# Patient Record
Sex: Female | Born: 1990 | Race: White | Hispanic: No | Marital: Single | Smoking: Never smoker
Health system: Southern US, Community
[De-identification: ages and names within clinical notes are randomized; demographics above are authoritative.]

## PROBLEM LIST (undated history)

## (undated) DIAGNOSIS — F419 Anxiety disorder, unspecified: Secondary | ICD-10-CM

## (undated) DIAGNOSIS — Z9889 Other specified postprocedural states: Secondary | ICD-10-CM

## (undated) DIAGNOSIS — R112 Nausea with vomiting, unspecified: Secondary | ICD-10-CM

## (undated) DIAGNOSIS — T8859XA Other complications of anesthesia, initial encounter: Secondary | ICD-10-CM

## (undated) DIAGNOSIS — T4145XA Adverse effect of unspecified anesthetic, initial encounter: Secondary | ICD-10-CM

## (undated) HISTORY — PX: TONSILLECTOMY: SUR1361

## (undated) HISTORY — PX: WISDOM TOOTH EXTRACTION: SHX21

---
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
## 2015-09-25 ENCOUNTER — Emergency Department
Admission: EM | Admit: 2015-09-25 | Discharge: 2015-09-25 | Disposition: A | Discharge status: Home / Self Care | Payer: BLUE CROSS/BLUE SHIELD | Attending: Emergency Medicine | Admitting: Emergency Medicine

## 2015-09-25 DIAGNOSIS — S51852A Open bite of left forearm, initial encounter: Secondary | ICD-10-CM | POA: Insufficient documentation

## 2015-09-25 DIAGNOSIS — W540XXA Bitten by dog, initial encounter: Secondary | ICD-10-CM | POA: Diagnosis not present

## 2015-09-25 DIAGNOSIS — Y9389 Activity, other specified: Secondary | ICD-10-CM | POA: Diagnosis not present

## 2015-09-25 DIAGNOSIS — Y929 Unspecified place or not applicable: Secondary | ICD-10-CM | POA: Insufficient documentation

## 2015-09-25 DIAGNOSIS — Z91040 Latex allergy status: Secondary | ICD-10-CM | POA: Insufficient documentation

## 2015-09-25 DIAGNOSIS — Y998 Other external cause status: Secondary | ICD-10-CM | POA: Insufficient documentation

## 2015-09-25 MED ORDER — AMOXICILLIN-POT CLAVULANATE 875-125 MG PO TABS
1.0000 | ORAL_TABLET | Freq: Once | ORAL | Status: AC
  Administered 2015-09-25: 1 via ORAL
  Filled 2015-09-25: qty 1

## 2015-09-25 MED ORDER — NAPROXEN 500 MG PO TABS: 500.0000 mg | ORAL_TABLET | Freq: Two times a day (BID) | ORAL | Status: AC

## 2015-09-25 MED ORDER — AMOXICILLIN-POT CLAVULANATE 875-125 MG PO TABS: 1.0000 | ORAL_TABLET | Freq: Two times a day (BID) | ORAL | Status: DC

## 2015-09-25 NOTE — ED Notes (Signed)
Pt reports approximately 1 hour ago was playing with her dog when his tooth went into her left arm. Small lac to left forearm. Bleeding controlled.

## 2015-09-25 NOTE — Discharge Instructions (Signed)
Animal Bite °Animal bite wounds can get infected. It is important to get proper medical treatment. Ask your doctor if you need rabies treatment. °HOME CARE  °Wound Care °· Follow instructions from your doctor about how to take care of your wound. Make sure you: °¨ Wash your hands with soap and water before you change your bandage (dressing). If you cannot use soap and water, use hand sanitizer. °¨ Change your bandage as told by your doctor. °¨ Leave stitches (sutures), skin glue, or skin tape (adhesive) strips in place. They may need to stay in place for 2 weeks or longer. If tape strips get loose and curl up, you may trim the loose edges. Do not remove tape strips completely unless your doctor says it is okay. °· Check your wound every day for signs of infection. Watch for:   °¨   Redness, swelling, or pain that gets worse.   °¨   Fluid, blood, or pus.   °General Instructions °· Take or apply over-the-counter and prescription medicines only as told by your doctor.   °· If you were prescribed an antibiotic, take or apply it as told by your doctor. Do not stop using the antibiotic even if your condition improves.   °· Keep the injured area raised (elevated) above the level of your heart while you are sitting or lying down. °· If directed, apply ice to the injured area.   °¨   Put ice in a plastic bag.   °¨   Place a towel between your skin and the bag.   °¨   Leave the ice on for 20 minutes, 2-3 times per day.   °· Keep all follow-up visits as told by your doctor. This is important.   °GET HELP IF: °· You have redness, swelling, or pain that gets worse.   °· You have a general feeling of sickness (malaise).   °· You feel sick to your stomach (nauseous). °· You throw up (vomit).   °· You have pain that does not get better.   °GET HELP RIGHT AWAY IF:  °· You have a red streak going away from your wound.   °· You have fluid, blood, or pus coming from your wound.   °· You have a fever or chills.   °· You have trouble  moving your injured area.   °· You have numbness or tingling anywhere on your body.   °  °This information is not intended to replace advice given to you by your health care provider. Make sure you discuss any questions you have with your health care provider. °  °Document Released: 05/15/2005 Document Revised: 02/03/2015 Document Reviewed: 09/30/2014 °Elsevier Interactive Patient Education ©2016 [employer] ° °

## 2015-09-25 NOTE — ED Provider Notes (Signed)
[facility] Emergency Department Provider Note   ____________________________________________  Time seen: Approximately 10:06 PM  I have reviewed the triage vital signs and the nursing notes.   HISTORY  Chief Complaint Animal Bite    HPI Brianna Odonnell is a 25 y.o. female patient complain a puncture wound secondary to dog bite from family pet. Incident occurred approximately one hour ago while playing with the dog. No active bleeding. Dog rabies shot is up-to-date. Patient tetanus shot is up-to-date. Patient denies any loss sensation or loss of function of the left upper extremity. Except for still dressing no palliative measures for this complaint.   No past medical history on file.  There are no active problems to display for this patient.   No past surgical history on file.  No current outpatient prescriptions on file.  Allergies Latex  No family history on file.  Social History Social History  Substance Use Topics  . Smoking status: Not on file  . Smokeless tobacco: Not on file  . Alcohol Use: Not on file    Review of Systems Constitutional: No fever/chills Eyes: No visual changes. ENT: No sore throat. Cardiovascular: Denies chest pain. Respiratory: Denies shortness of breath. Gastrointestinal: No abdominal pain.  No nausea, no vomiting.  No diarrhea.  No constipation. Genitourinary: Negative for dysuria. Musculoskeletal: Negative for back pain. Skin: Negative for rash. Dog bite left forearm Neurological: Negative for headaches, focal weakness or numbness. Hematological/Lymphatic: Allergic/Immunilogical: Latex ____________________________________________   PHYSICAL EXAM:  VITAL SIGNS: ED Triage Vitals  Enc Vitals Group     BP 09/25/15 2055 129/81 mmHg     Pulse Rate 09/25/15 2055 62     Resp 09/25/15 2055 18     Temp 09/25/15 2055 98.4 F (36.9 C)     Temp Source 09/25/15 2055 Oral     SpO2 09/25/15 2055 97 %   Weight 09/25/15 2055 195 lb (88.451 kg)     Height 09/25/15 2055  (1.676 m)     Head Cir --      Peak Flow --      Pain Score 09/25/15 2056 1     Pain Loc --      Pain Edu? --      Excl. in GC? --     Constitutional: Alert and oriented. Well appearing and in no acute distress. Eyes: Conjunctivae are normal. PERRL. EOMI. Head: Atraumatic. Nose: No congestion/rhinnorhea. Mouth/Throat: Mucous membranes are moist.  Oropharynx non-erythematous. Neck: No stridor.  No cervical spine tenderness to palpation. Hematological/Lymphatic/Immunilogical: No cervical lymphadenopathy. Cardiovascular: Normal rate, regular rhythm. Grossly normal heart sounds.  Good peripheral circulation. Respiratory: Normal respiratory effort.  No retractions. Lungs CTAB. Gastrointestinal: Soft and nontender. No distention. No abdominal bruits. No CVA tenderness. Musculoskeletal: No lower extremity tenderness nor edema.  No joint effusions. Neurologic:  Normal speech and language. No gross focal neurologic deficits are appreciated. No gait instability. Skin:  Puncture wound left forearm secondary to dog bite. Psychiatric: Mood and affect are normal. Speech and behavior are normal.  ____________________________________________   LABS (all labs ordered are listed, but only abnormal results are displayed)  Labs Reviewed - No data to display ____________________________________________  EKG   ____________________________________________  RADIOLOGY   ____________________________________________   PROCEDURES  Procedure(s) performed: None  Critical Care performed: No  ____________________________________________   INITIAL IMPRESSION / ASSESSMENT AND PLAN / ED COURSE  Pertinent labs & imaging results that were available during my care of the patient were reviewed by me  and considered in my medical decision making (see chart for details).  Puncture wound left forearm secondary to dog bite. Area was  completely irrigated with normal saline and bandage. Patient started on Augmentin and naproxen. Patient given discharge Instructions. ____________________________________________   FINAL CLINICAL IMPRESSION(S) / ED DIAGNOSES  Final diagnoses:  Dog bite of left forearm, initial encounter      NEW MEDICATIONS STARTED DURING THIS VISIT:  New Prescriptions   No medications on file     Note:  This document was prepared using Dragon voice recognition software and may include unintentional dictation errors.      Joni Reiningonald K Smith, PA-C 09/25/15 2212  Governor Rooksebecca Lord, MD 09/26/15 [phone]

## 2015-09-25 NOTE — ED Notes (Signed)
Officer pascal with [place] police department notified regarding animal bite. Officer pascal to interview pt.

## 2017-05-29 HISTORY — PX: UTERINE SEPTUM RESECTION: SHX5386

## 2017-10-19 ENCOUNTER — Other Ambulatory Visit: Payer: Self-pay | Admitting: Obstetrics and Gynecology

## 2017-10-19 DIAGNOSIS — Q51828 Other congenital malformations of cervix: Secondary | ICD-10-CM

## 2017-10-26 ENCOUNTER — Ambulatory Visit
Admission: RE | Admit: 2017-10-26 | Discharge: 2017-10-26 | Disposition: A | Discharge status: Home / Self Care | Payer: Managed Care, Other (non HMO) | Source: Ambulatory Visit | Attending: Obstetrics and Gynecology | Admitting: Obstetrics and Gynecology

## 2017-10-26 DIAGNOSIS — Q51828 Other congenital malformations of cervix: Secondary | ICD-10-CM

## 2017-10-26 MED ORDER — GADOBENATE DIMEGLUMINE 529 MG/ML IV SOLN
20.0000 mL | Freq: Once | INTRAVENOUS | Status: AC | PRN
  Administered 2017-10-26: 20 mL via INTRAVENOUS

## 2019-02-12 ENCOUNTER — Other Ambulatory Visit: Payer: Self-pay | Admitting: Podiatry

## 2019-02-17 ENCOUNTER — Other Ambulatory Visit: Payer: Self-pay

## 2019-02-17 ENCOUNTER — Other Ambulatory Visit
Admission: RE | Admit: 2019-02-17 | Discharge: 2019-02-17 | Disposition: A | Discharge status: Home / Self Care | Payer: Commercial Managed Care - PPO | Source: Ambulatory Visit | Attending: Podiatry | Admitting: Podiatry

## 2019-02-17 DIAGNOSIS — Z20828 Contact with and (suspected) exposure to other viral communicable diseases: Secondary | ICD-10-CM | POA: Insufficient documentation

## 2019-02-17 DIAGNOSIS — Z01812 Encounter for preprocedural laboratory examination: Secondary | ICD-10-CM | POA: Insufficient documentation

## 2019-02-17 LAB — SARS CORONAVIRUS 2 (TAT 6-24 HRS): SARS Coronavirus 2: NEGATIVE

## 2019-02-20 ENCOUNTER — Ambulatory Visit: Payer: Commercial Managed Care - PPO | Admitting: Anesthesiology

## 2019-02-20 ENCOUNTER — Ambulatory Visit
Admission: RE | Admit: 2019-02-20 | Discharge: 2019-02-20 | Disposition: A | Discharge status: Home / Self Care | Payer: Commercial Managed Care - PPO | Attending: Podiatry | Admitting: Podiatry

## 2019-02-20 ENCOUNTER — Encounter
Admission: RE | Disposition: A | Discharge status: Home / Self Care | Payer: Self-pay | Source: Home / Self Care | Attending: Podiatry

## 2019-02-20 DIAGNOSIS — X58XXXA Exposure to other specified factors, initial encounter: Secondary | ICD-10-CM | POA: Diagnosis not present

## 2019-02-20 DIAGNOSIS — S92241A Displaced fracture of medial cuneiform of right foot, initial encounter for closed fracture: Secondary | ICD-10-CM | POA: Diagnosis not present

## 2019-02-20 DIAGNOSIS — S92321A Displaced fracture of second metatarsal bone, right foot, initial encounter for closed fracture: Secondary | ICD-10-CM | POA: Diagnosis not present

## 2019-02-20 DIAGNOSIS — S93324A Dislocation of tarsometatarsal joint of right foot, initial encounter: Secondary | ICD-10-CM | POA: Insufficient documentation

## 2019-02-20 HISTORY — DX: Other specified postprocedural states: Z98.890

## 2019-02-20 HISTORY — PX: OPEN REDUCTION INTERNAL FIXATION (ORIF) FOOT LISFRANC FRACTURE: SHX5990

## 2019-02-20 HISTORY — DX: Nausea with vomiting, unspecified: R11.2

## 2019-02-20 HISTORY — DX: Anxiety disorder, unspecified: F41.9

## 2019-02-20 HISTORY — DX: Other complications of anesthesia, initial encounter: T88.59XA

## 2019-02-20 LAB — POCT PREGNANCY, URINE: Preg Test, Ur: NEGATIVE

## 2019-02-20 SURGERY — OPEN REDUCTION INTERNAL FIXATION (ORIF) FOOT LISFRANC FRACTURE
Anesthesia: General | Site: Foot | Laterality: Right

## 2019-02-20 MED ORDER — CEFAZOLIN SODIUM-DEXTROSE 2-3 GM-%(50ML) IV SOLR
INTRAVENOUS | Status: DC | PRN
  Administered 2019-02-20: 2 g via INTRAVENOUS

## 2019-02-20 MED ORDER — FENTANYL CITRATE (PF) 100 MCG/2ML IJ SOLN: 25.0000 ug | INTRAMUSCULAR | Status: DC | PRN

## 2019-02-20 MED ORDER — CEPHALEXIN 500 MG PO CAPS: 500.0000 mg | ORAL_CAPSULE | Freq: Three times a day (TID) | ORAL | 0 refills | Status: AC

## 2019-02-20 MED ORDER — LIDOCAINE HCL (CARDIAC) PF 100 MG/5ML IV SOSY
PREFILLED_SYRINGE | INTRAVENOUS | Status: DC | PRN
  Administered 2019-02-20: 30 mg via INTRAVENOUS

## 2019-02-20 MED ORDER — DEXAMETHASONE SODIUM PHOSPHATE 4 MG/ML IJ SOLN
INTRAMUSCULAR | Status: DC | PRN
  Administered 2019-02-20: 8 mg via INTRAVENOUS

## 2019-02-20 MED ORDER — POVIDONE-IODINE 7.5 % EX SOLN
Freq: Once | CUTANEOUS | Status: AC
  Administered 2019-02-20: 13:00:00 via TOPICAL

## 2019-02-20 MED ORDER — OXYCODONE-ACETAMINOPHEN 5-325 MG PO TABS: 1.0000 | ORAL_TABLET | Freq: Four times a day (QID) | ORAL | 0 refills | Status: AC | PRN

## 2019-02-20 MED ORDER — ONDANSETRON HCL 4 MG PO TABS: 4.0000 mg | ORAL_TABLET | Freq: Three times a day (TID) | ORAL | 0 refills | Status: DC | PRN

## 2019-02-20 MED ORDER — FENTANYL CITRATE (PF) 100 MCG/2ML IJ SOLN
INTRAMUSCULAR | Status: DC | PRN
  Administered 2019-02-20 (×2): 100 ug via INTRAVENOUS

## 2019-02-20 MED ORDER — ASPIRIN EC 325 MG PO TBEC: 325.0000 mg | DELAYED_RELEASE_TABLET | Freq: Every day | ORAL | 0 refills | Status: AC

## 2019-02-20 MED ORDER — ROPIVACAINE HCL 5 MG/ML IJ SOLN
INTRAMUSCULAR | Status: DC | PRN
  Administered 2019-02-20: 40 mL via PERINEURAL

## 2019-02-20 MED ORDER — ACETAMINOPHEN 325 MG PO TABS: 325.0000 mg | ORAL_TABLET | ORAL | Status: DC | PRN

## 2019-02-20 MED ORDER — MIDAZOLAM HCL 2 MG/2ML IJ SOLN
INTRAMUSCULAR | Status: DC | PRN
  Administered 2019-02-20 (×2): 2 mg via INTRAVENOUS

## 2019-02-20 MED ORDER — CEFAZOLIN SODIUM-DEXTROSE 2-4 GM/100ML-% IV SOLN
2.0000 g | INTRAVENOUS | Status: AC
  Administered 2019-02-20: 2 g via INTRAVENOUS

## 2019-02-20 MED ORDER — ONDANSETRON HCL 4 MG/2ML IJ SOLN
INTRAMUSCULAR | Status: DC | PRN
  Administered 2019-02-20: 4 mg via INTRAVENOUS

## 2019-02-20 MED ORDER — ACETAMINOPHEN 160 MG/5ML PO SOLN: 325.0000 mg | ORAL | Status: DC | PRN

## 2019-02-20 MED ORDER — OXYCODONE HCL 5 MG/5ML PO SOLN: 5.0000 mg | Freq: Once | ORAL | Status: DC | PRN

## 2019-02-20 MED ORDER — ONDANSETRON HCL 4 MG/2ML IJ SOLN
4.0000 mg | Freq: Once | INTRAMUSCULAR | Status: AC | PRN
  Administered 2019-02-20: 4 mg via INTRAVENOUS

## 2019-02-20 MED ORDER — OXYCODONE HCL 5 MG PO TABS: 5.0000 mg | ORAL_TABLET | Freq: Once | ORAL | Status: DC | PRN

## 2019-02-20 MED ORDER — SCOPOLAMINE 1 MG/3DAYS TD PT72
1.0000 | MEDICATED_PATCH | Freq: Once | TRANSDERMAL | Status: DC
  Administered 2019-02-20: 1.5 mg via TRANSDERMAL

## 2019-02-20 MED ORDER — LACTATED RINGERS IV SOLN
INTRAVENOUS | Status: DC
  Administered 2019-02-20: 12:00:00 via INTRAVENOUS

## 2019-02-20 MED ORDER — PROPOFOL 500 MG/50ML IV EMUL
INTRAVENOUS | Status: DC | PRN
  Administered 2019-02-20: 100 ug/kg/min via INTRAVENOUS

## 2019-02-20 SURGICAL SUPPLY — 37 items
BANDAGE ELASTIC 4 VELCRO NS (GAUZE/BANDAGES/DRESSINGS) ×3 IMPLANT
BANDAGE ELASTIC 6 VELCRO NS (GAUZE/BANDAGES/DRESSINGS) ×3 IMPLANT
BIT DRILL CANNULTD 2.6 X 130MM (DRILL) ×1 IMPLANT
BNDG ESMARK 4X12 TAN STRL LF (GAUZE/BANDAGES/DRESSINGS) ×3 IMPLANT
BNDG GAUZE 4.5X4.1 6PLY STRL (MISCELLANEOUS) ×3 IMPLANT
CANISTER SUCT 1200ML W/VALVE (MISCELLANEOUS) ×3 IMPLANT
COUNTERSICK 4.0 HEADED (MISCELLANEOUS) ×3
DRAPE FLUOR MINI C-ARM 54X84 (DRAPES) ×3 IMPLANT
DRILL CANNULATED 2.6 X 130MM (DRILL) ×3
DURAPREP 26ML APPLICATOR (WOUND CARE) ×3 IMPLANT
ELECT REM PT RETURN 9FT ADLT (ELECTROSURGICAL) ×3
ELECTRODE REM PT RTRN 9FT ADLT (ELECTROSURGICAL) ×1 IMPLANT
GAUZE SPONGE 4X4 12PLY STRL (GAUZE/BANDAGES/DRESSINGS) ×3 IMPLANT
GAUZE XEROFORM 1X8 LF (GAUZE/BANDAGES/DRESSINGS) ×3 IMPLANT
GOWN STRL REUS W/ TWL LRG LVL3 (GOWN DISPOSABLE) ×2 IMPLANT
GOWN STRL REUS W/TWL LRG LVL3 (GOWN DISPOSABLE) ×4
K-WIRE SNGL END 1.2X150 (MISCELLANEOUS) ×6
KIT TURNOVER KIT A (KITS) ×3 IMPLANT
KWIRE SNGL END 1.2X150 (MISCELLANEOUS) ×2 IMPLANT
NS IRRIG 500ML POUR BTL (IV SOLUTION) ×3 IMPLANT
PACK EXTREMITY ARMC (MISCELLANEOUS) ×3 IMPLANT
PAD CAST CTTN 4X4 STRL (SOFTGOODS) ×1 IMPLANT
PADDING CAST COTTON 4X4 STRL (SOFTGOODS) ×2
PENCIL SMOKE EVACUATOR (MISCELLANEOUS) ×3 IMPLANT
SCREW 4.0 X 26 ST (Screw) ×3 IMPLANT
SCREW 4.0X30MM (Screw) ×2 IMPLANT
SCREW BN ST 30X4XCANN HD (Screw) ×1 IMPLANT
SCREW COUNTERSINK 4.0 HEADED (MISCELLANEOUS) ×1 IMPLANT
STOCKINETTE IMPERVIOUS LG (DRAPES) ×3 IMPLANT
SUT ETHILON 4-0 (SUTURE) ×4
SUT ETHILON 4-0 FS2 18XMFL BLK (SUTURE) ×2
SUT MNCRL 4-0 (SUTURE) ×2
SUT MNCRL 4-0 27XMFL (SUTURE) ×1
SUT VIC AB 3-0 SH 27 (SUTURE) ×2
SUT VIC AB 3-0 SH 27X BRD (SUTURE) ×1 IMPLANT
SUTURE ETHLN 4-0 FS2 18XMF BLK (SUTURE) ×2 IMPLANT
SUTURE MNCRL 4-0 27XMF (SUTURE) ×1 IMPLANT

## 2019-02-20 NOTE — Op Note (Signed)
PODIATRY / FOOT AND ANKLE SURGERY OPERATIVE REPORT    SURGEON: [name], DPM  PRE-OPERATIVE DIAGNOSIS:  1.  Right Lisfranc fracture dislocation involving the medial cuneiform and second metatarsal as well as first intercuneiform joints.  POST-OPERATIVE DIAGNOSIS: Same  PROCEDURE(S): 1. Right open reduction internal fixation Lisfranc fracture dislocation with open screw fixation across the medial cuneiform and second metatarsal and across the first intercuneiform joints. 2. Application posterior splint right  HEMOSTASIS: Right ankle tourniquet   ANESTHESIA: general  ESTIMATED BLOOD LOSS: 10 cc  FINDING(S): 1.  Lisfranc fracture dislocation with slight lateral translation of the second metatarsal and intercuneiform gapping, diastases between first and second metatarsals.  PATHOLOGY/SPECIMEN(S): None  INDICATIONS:   Brianna Odonnell is a 28 y.o. female who presents with a Lisfranc fracture dislocation to the right foot.  Patient was seen in [place] after injury and had a CT scan performed showing a Lisfranc fracture dislocation involving the first metatarsal to medial cuneiform as well as the intercuneiform joints.  Small fractures were able to be seen at the distal lateral medial cuneiform and medial second metatarsal base with minimal comminution and minimal articular involvement.  Discussed all treatment options with the patient and at this time the patient is elected for surgery consisting of Lisfranc fracture open reduction internal fixation right foot.  DESCRIPTION: After obtaining full informed written consent, the patient was brought back to the operating room and placed supine upon the operating table.  The patient received IV antibiotics prior to induction.  After obtaining adequate anesthesia, the patient was prepped and draped in the standard fashion.  An Esmarch bandage was used to exsanguinate the right lower extremity and the pneumatic ankle tourniquet was  inflated.  Attention was directed to the dorsum of the foot and C-arm imaging was used to verify position of incision between the first and second metatarsals and intercuneiform joint area.  This incision was marked out and an incision was made over this area starting from the intermediate cuneiform area to between the first and second metatarsals.  The incision was deepened to subcutaneous tissues and all vital neurovascular structures were retracted in any venous structures obstructing the view were cauterized as necessary.  At this time an incision was made into the deep fascia overlying the extensor hallucis brevis tendon.  The extensor hallucis brevis tendon was identified and retracted out of the way along with the neurovascular bundle in a medial direction throughout the remainder the case.  At this time a capsular incision was made into the second TMTJ and the periosteum was reflected medially and laterally thereby exposing the joint at the operative site.  The small piece that was fractured appeared to be nondisplaced and mildly intra-articular but was very small in nature and not removed at the medial aspect of the base the second metatarsal.  The small fracture present off of the distal lateral aspect of the medial cuneiform also did not appear to be displaced and appeared to be in a reduced position overall.  The second metatarsal base appeared to be translated on the second cuneiform approximately 1 to 2 mm laterally with some diastases between the first and second metatarsals clinically and radiographically.  A stress test was then performed to the neck cuneiforms joints and was noted to also have instability with gapping in this area.  At this time the large reduction forcep was then used to reduce the second metatarsal closer to the first metatarsal reducing the diastases present and realigning the second  metatarsal with the second cuneiform joint.  C-arm imaging was utilized to verify correct  position.  Position was noted to be excellent this time.    An incision was made across the medial cuneiform medially and the incision was deepened to the subcutaneous tissues taking care to retract all vital neurovascular structures and all venous contributories were cauterized as necessary.  A periosteal incision was then made into the medial cuneiform and the periosteum was reflected dorsally and plantarly at the operative site.    At this time a guidewire for a 4.0 cannulated partially threaded screw was then placed from the proximal medial medial cuneiforms directed across the cuneiforms in a distal lateral direction through the second metatarsal base medially and out the second metatarsal base laterally.  C-arm imaging again was utilized verify correct position of wire which appeared to be excellent.  At this time utilizing standard AO principles and techniques a 4.0 x 12mm partially threaded cannulated Paragon 28 screw was placed with excellent compression.  The diastases appear to be well reduced on C-arm imaging.  The guidewire was removed and passed off the operative site.  At this time the reduction clamp was then used to reduce the second cuneiform to the medial cuneiform and the diastases between the 2 bones appear to be well reduced once again.  At this time a guidewire was then ran across the medial cuneiform and into the intermediate cuneiform under fluoroscopic guidance.  Using standard AO principles and techniques a 4.0 x 31mm partially-threaded cannulated Paragon 28 screw was placed across the guidewire and across the cuneiforms with excellent compression.  The diastases appeared to be well reduced at this time.  The guidewire was removed and passed off the operative site.  The surgical sites were then flushed with copious amounts normal sterile saline.  Final C-arm images were then taking once again showing the reduction of the Lisfranc fracture dislocation with screws the appropriate  size and length.  The capsular and periosteal tissue was reapproximated well coapted with 2-0 Vicryl along with the deep fascia.  The subcutaneous tissue was reapproximated well coapted with 4-0 Monocryl.  And the skin was then reapproximated well coapted with 4-0 nylon in horizontal mattress type stitching.  The pneumatic ankle tourniquet was deflated and a prompt hyperemic response noted all digits the right foot.  The patient tolerated the procedure and anesthesia well was transferred to recovery room fossae and stable.  After period of recovery in the postoperative area the patient be discharged home the following written oral postop instructions: Keep surgical dressings clean, dry, and intact until postoperative visit #1, ice and elevate right lower extremity when at rest, take postop pain medication, antibiotics, antinausea medicine as prescribed to prescribed, remain nonweightbearing to the right lower extremity all times, follow-up within 1 week of surgical date.   COMPLICATIONS: None  CONDITION: Good, stable  Brianna Odonnell

## 2019-02-20 NOTE — H&P (Signed)
HISTORY AND PHYSICAL INTERVAL NOTE:  02/20/2019  1:36 PM  Brianna Odonnell Brianna Odonnell  has presented today for surgery, with the diagnosis of M79.671 RIGHT FOOT PAIN S93.324A LISFRANC DISLOCATION RIGHT S92.241A CLOSE DISPLACED FRACTURE MEDIAL CUNEIFORM RIGHT FOOT S92.321A CLOSED DISPLACED FRACTURE SECOND METATARSAL BONE RIGHT FOOT.  The various methods of treatment have been discussed with the patient.  No guarantees were given.  After consideration of risks, benefits and other options for treatment, the patient has consented to surgery.  I have reviewed the patients' chart and labs.    Procedure: Right foot Lisfranc fracture ORIF (medial cuneiform to 2nd metatarsal and possible 1st intermediate cuneiform joint fixation)  A history and physical examination was performed in my office.  The patient was reexamined.  There have been no changes to this history and physical examination.  Brianna Odonnell

## 2019-02-20 NOTE — Anesthesia Postprocedure Evaluation (Signed)
Anesthesia Post Note  Patient: Brianna Odonnell  Procedure(s) Performed: LISFRANC;EACH JOINT X 2 RIGHT (Right Foot)  Patient location during evaluation: PACU Anesthesia Type: General Level of consciousness: awake and alert and oriented Pain management: satisfactory to patient Vital Signs Assessment: post-procedure vital signs reviewed and stable Respiratory status: spontaneous breathing, nonlabored ventilation and respiratory function stable Cardiovascular status: blood pressure returned to baseline and stable Postop Assessment: Adequate PO intake and No signs of nausea or vomiting Anesthetic complications: no    Raliegh Ip

## 2019-02-20 NOTE — Anesthesia Procedure Notes (Signed)
Procedure Name: General with mask airway Performed by: Izetta Dakin, CRNA Pre-anesthesia Checklist: Timeout performed, Patient being monitored, Suction available, Emergency Drugs available and Patient identified Patient Re-evaluated:Patient Re-evaluated prior to induction Oxygen Delivery Method: Non-rebreather mask Preoxygenation: Pre-oxygenation with 100% oxygen

## 2019-02-20 NOTE — Anesthesia Procedure Notes (Signed)
Anesthesia Regional Block: Adductor canal block   Pre-Anesthetic Checklist: ,, timeout performed, Correct Patient, Correct Site, Correct Laterality, Correct Procedure, Correct Position, site marked, Risks and benefits discussed,  Surgical consent,  Pre-op evaluation,  At surgeon's request and post-op pain management  Laterality: Right  Prep: chloraprep       Needles:  Injection technique: Single-shot  Needle Type: Echogenic Needle     Needle Length: 9cm  Needle Gauge: 21     Additional Needles:   Procedures:,,,, ultrasound used (permanent image in chart),,,,  Narrative:  Start time: 02/20/2019 12:32 PM End time: 02/20/2019 12:52 PM Injection made incrementally with aspirations every 5 mL.  Performed by: Personally  Anesthesiologist: Ronelle Nigh, MD  Additional Notes: Functioning IV was confirmed and monitors applied. Ultrasound guidance: relevant anatomy identified, needle position confirmed, local anesthetic spread visualized around nerve(s)., vascular puncture avoided.  Image printed for medical record.  Negative aspiration and no paresthesias; incremental administration of local anesthetic. The patient tolerated the procedure well. Vitals signes recorded in RN notes. 15 ml

## 2019-02-20 NOTE — Discharge Instructions (Signed)
[facility]  POST OPERATIVE INSTRUCTIONS FOR DR. TROXLER, DR. Ether Griffins, AND DR. River Ambrosio [facility]   1. Take your medication as prescribed.  Pain medication should be taken only as needed.  If having issues tolerating the pain medication given, please call office for instructions or changes in dosages.  2. Keep the dressing clean, dry and intact.  3. Keep your foot elevated above the heart level for the first 48 hours.  4. Walking to the bathroom and brief periods of walking are acceptable, unless we have instructed you to be non-weight bearing.  5. Always wear your post-op shoe when walking.  Always use your crutches if you are to be non-weight bearing.  6. Do not take a shower. Baths are permissible as long as the foot is kept out of the water.   7. Every hour you are awake:  - Bend your knee 15 times. - Flex foot 15 times - Massage calf 15 times  8. Call [facility] [phone]) if any of the following problems occur: - You develop a temperature or fever. - The bandage becomes saturated with blood. - Medication does not stop your pain. - Injury of the foot occurs. - Any symptoms of infection including redness, odor, or red streaks running from wound.  Scopolamine skin patches REMOVE PATCH IN 72 HOURS AND WASH HANDS IMMEDIATELY  What is this medicine? SCOPOLAMINE (skoe POL a meen) is used to prevent nausea and vomiting caused by motion sickness, anesthesia and surgery. This medicine may be used for other purposes; ask your health care provider or pharmacist if you have questions. COMMON BRAND NAME(S): Transderm Scop What should I tell my health care provider before I take this medicine? They need to know if you have any of these conditions:  are scheduled to have a gastric secretion test  glaucoma  heart disease  kidney disease  liver disease  lung or breathing disease, like asthma  mental  illness  prostate disease  seizures  stomach or intestine problems  trouble passing urine  an unusual or allergic reaction to scopolamine, atropine, other medicines, foods, dyes, or preservatives  pregnant or trying to get pregnant  breast-feeding How should I use this medicine? This medicine is for external use only. Follow the directions on the prescription label. Wear only 1 patch at a time. Choose an area behind the ear, that is clean, dry, hairless and free from any cuts or irritation. Wipe the area with a clean dry tissue. Peel off the plastic backing of the skin patch, trying not to touch the adhesive side with your hands. Do not cut the patches. Firmly apply to the area you have chosen, with the metallic side of the patch to the skin and the tan-colored side showing. Once firmly in place, wash your hands well with soap and water. Do not get this medicine into your eyes. After removing the patch, wash your hands and the area behind your ear thoroughly with soap and water. The patch will still contain some medicine after use. To avoid accidental contact or ingestion by children or pets, fold the used patch in half with the sticky side together and throw away in the trash out of the reach of children and pets. If you need to use a second patch after you remove the first, place it behind the other ear. A special MedGuide will be given to you by the pharmacist with each prescription and refill. Be sure to read  this information carefully each time. Talk to your pediatrician regarding the use of this medicine in children. Special care may be needed. Overdosage: If you think you have taken too much of this medicine contact a poison control center or emergency room at once. NOTE: This medicine is only for you. Do not share this medicine with others. What if I miss a dose? This does not apply. This medicine is not for regular use. What may interact with this  medicine?  alcohol  antihistamines for allergy cough and cold  atropine  certain medicines for anxiety or sleep  certain medicines for bladder problems like oxybutynin, tolterodine  certain medicines for depression like amitriptyline, fluoxetine, sertraline  certain medicines for stomach problems like dicyclomine, hyoscyamine  certain medicines for Parkinson's disease like benztropine, trihexyphenidyl  certain medicines for seizures like phenobarbital, primidone  general anesthetics like halothane, isoflurane, methoxyflurane, propofol  ipratropium  local anesthetics like lidocaine, pramoxine, tetracaine  medicines that relax muscles for surgery  phenothiazines like chlorpromazine, mesoridazine, prochlorperazine, thioridazine  narcotic medicines for pain  other belladonna alkaloids This list may not describe all possible interactions. Give your health care provider a list of all the medicines, herbs, non-prescription drugs, or dietary supplements you use. Also tell them if you smoke, drink alcohol, or use illegal drugs. Some items may interact with your medicine. What should I watch for while using this medicine? Limit contact with water while swimming and bathing because the patch may fall off. If the patch falls off, throw it away and put a new one behind the other ear. You may get drowsy or dizzy. Do not drive, use machinery, or do anything that needs mental alertness until you know how this medicine affects you. Do not stand or sit up quickly, especially if you are an older patient. This reduces the risk of dizzy or fainting spells. Alcohol may interfere with the effect of this medicine. Avoid alcoholic drinks. Your mouth may get dry. Chewing sugarless gum or sucking hard candy, and drinking plenty of water may help. Contact your healthcare professional if the problem does not go away or is severe. This medicine may cause dry eyes and blurred vision. If you wear contact  lenses, you may feel some discomfort. Lubricating drops may help. See your healthcare professional if the problem does not go away or is severe. If you are going to need surgery, an MRI, CT scan, or other procedure, tell your healthcare professional that you are using this medicine. You may need to remove the patch before the procedure. What side effects may I notice from receiving this medicine? Side effects that you should report to your doctor or health care professional as soon as possible:  allergic reactions like skin rash, itching or hives; swelling of the face, lips, or tongue  blurred vision  changes in vision  confusion  dizziness  eye pain  fast, irregular heartbeat  hallucinations, loss of contact with reality  nausea, vomiting  pain or trouble passing urine  restlessness  seizures  skin irritation  stomach pain Side effects that usually do not require medical attention (report to your doctor or health care professional if they continue or are bothersome):  drowsiness  dry mouth  headache  sore throat This list may not describe all possible side effects. Call your doctor for medical advice about side effects. You may report side effects to FDA at 1-800-FDA-1088. Where should I keep my medicine? Keep out of the reach of children. Store at room temperature  between 20 and 25 degrees C (68 and 77 degrees F). Keep this medicine in the foil package until ready to use. Throw away any unused medicine after the expiration date. NOTE: This sheet is a summary. It may not cover all possible information. If you have questions about this medicine, talk to your doctor, pharmacist, or health care provider.  2020 Elsevier/Gold Standard (2017-08-03 16:14:46)   General Anesthesia, Adult, Care After This sheet gives you information about how to care for yourself after your procedure. Your health care provider may also give you more specific instructions. If you have  problems or questions, contact your health care provider. What can I expect after the procedure? After the procedure, the following side effects are common:  Pain or discomfort at the IV site.  Nausea.  Vomiting.  Sore throat.  Trouble concentrating.  Feeling cold or chills.  Weak or tired.  Sleepiness and fatigue.  Soreness and body aches. These side effects can affect parts of the body that were not involved in surgery. Follow these instructions at home:  For at least 24 hours after the procedure:  Have a responsible adult stay with you. It is important to have someone help care for you until you are awake and alert.  Rest as needed.  Do not: ? Participate in activities in which you could fall or become injured. ? Drive. ? Use heavy machinery. ? Drink alcohol. ? Take sleeping pills or medicines that cause drowsiness. ? Make important decisions or sign legal documents. ? Take care of children on your own. Eating and drinking  Follow any instructions from your health care provider about eating or drinking restrictions.  When you feel hungry, start by eating small amounts of foods that are soft and easy to digest (bland), such as toast. Gradually return to your regular diet.  Drink enough fluid to keep your urine pale yellow.  If you vomit, rehydrate by drinking water, juice, or clear broth. General instructions  If you have sleep apnea, surgery and certain medicines can increase your risk for breathing problems. Follow instructions from your health care provider about wearing your sleep device: ? Anytime you are sleeping, including during daytime naps. ? While taking prescription pain medicines, sleeping medicines, or medicines that make you drowsy.  Return to your normal activities as told by your health care provider. Ask your health care provider what activities are safe for you.  Take over-the-counter and prescription medicines only as told by your health  care provider.  If you smoke, do not smoke without supervision.  Keep all follow-up visits as told by your health care provider. This is important. Contact a health care provider if:  You have nausea or vomiting that does not get better with medicine.  You cannot eat or drink without vomiting.  You have pain that does not get better with medicine.  You are unable to pass urine.  You develop a skin rash.  You have a fever.  You have redness around your IV site that gets worse. Get help right away if:  You have difficulty breathing.  You have chest pain.  You have blood in your urine or stool, or you vomit blood. Summary  After the procedure, it is common to have a sore throat or nausea. It is also common to feel tired.  Have a responsible adult stay with you for the first 24 hours after general anesthesia. It is important to have someone help care for you until you are awake  and alert.  When you feel hungry, start by eating small amounts of foods that are soft and easy to digest (bland), such as toast. Gradually return to your regular diet.  Drink enough fluid to keep your urine pale yellow.  Return to your normal activities as told by your health care provider. Ask your health care provider what activities are safe for you. This information is not intended to replace advice given to you by your health care provider. Make sure you discuss any questions you have with your health care provider. Document Released: 08/21/2000 Document Revised: 05/18/2017 Document Reviewed: 12/29/2016 Elsevier Patient Education  2020 [employer].

## 2019-02-20 NOTE — Transfer of Care (Signed)
Immediate Anesthesia Transfer of Care Note  Patient: Brianna Odonnell  Procedure(s) Performed: LISFRANC;EACH JOINT X 2 RIGHT (Right Foot)  Patient Location: PACU  Anesthesia Type: General  Level of Consciousness: awake, alert  and patient cooperative  Airway and Oxygen Therapy: Patient Spontanous Breathing and Patient connected to supplemental oxygen  Post-op Assessment: Post-op Vital signs reviewed, Patient's Cardiovascular Status Stable, Respiratory Function Stable, Patent Airway and No signs of Nausea or vomiting  Post-op Vital Signs: Reviewed and stable  Complications: No apparent anesthesia complications

## 2019-02-20 NOTE — Progress Notes (Signed)
Assisted Mike Stella ANMD with right, ultrasound guided, popliteal, adductor canal block. Side rails up, monitors on throughout procedure. See vital signs in flow sheet. Tolerated Procedure well.  

## 2019-02-20 NOTE — Anesthesia Preprocedure Evaluation (Signed)
Anesthesia Evaluation  Patient identified by MRN, date of birth, ID band Patient awake    Reviewed: Allergy & Precautions, H&P , NPO status , Patient's Chart, lab work & pertinent test results  History of Anesthesia Complications (+) PONV and history of anesthetic complications  Airway Mallampati: II  TM Distance: >3 FB Neck ROM: full    Dental no notable dental hx.    Pulmonary    Pulmonary exam normal breath sounds clear to auscultation       Cardiovascular Normal cardiovascular exam Rhythm:regular Rate:Normal     Neuro/Psych    GI/Hepatic   Endo/Other    Renal/GU      Musculoskeletal   Abdominal   Peds  Hematology   Anesthesia Other Findings   Reproductive/Obstetrics                             Anesthesia Physical Anesthesia Plan  ASA: II  Anesthesia Plan: General   Post-op Pain Management:    Induction:   PONV Risk Score and Plan: 4 or greater and Ondansetron, Dexamethasone, Propofol infusion and Scopolamine patch - Pre-op  Airway Management Planned:   Additional Equipment:   Intra-op Plan:   Post-operative Plan:   Informed Consent: I have reviewed the patients History and Physical, chart, labs and discussed the procedure including the risks, benefits and alternatives for the proposed anesthesia with the patient or authorized representative who has indicated his/her understanding and acceptance.       Plan Discussed with: CRNA  Anesthesia Plan Comments:         Anesthesia Quick Evaluation

## 2019-02-20 NOTE — Anesthesia Procedure Notes (Signed)
Anesthesia Regional Block: Popliteal block   Pre-Anesthetic Checklist: ,, timeout performed, Correct Patient, Correct Site, Correct Laterality, Correct Procedure, Correct Position, site marked, Risks and benefits discussed,  Surgical consent,  Pre-op evaluation,  At surgeon's request and post-op pain management  Laterality: Right  Prep: chloraprep       Needles:  Injection technique: Single-shot  Needle Type: Echogenic Needle     Needle Length: 9cm  Needle Gauge: 21     Additional Needles:   Procedures:,,,, ultrasound used (permanent image in chart),,,,  Narrative:  Start time: 02/20/2019 12:32 PM End time: 02/20/2019 12:52 PM Injection made incrementally with aspirations every 5 mL.  Performed by: Personally  Anesthesiologist: Ronelle Nigh, MD  Additional Notes: Functioning IV was confirmed and monitors applied. Ultrasound guidance: relevant anatomy identified, needle position confirmed, local anesthetic spread visualized around nerve(s)., vascular puncture avoided.  Image printed for medical record.  Negative aspiration and no paresthesias; incremental administration of local anesthetic. The patient tolerated the procedure well. Vitals signes recorded in RN notes. 25 ml

## 2019-04-10 ENCOUNTER — Telehealth: Payer: Self-pay | Admitting: Physical Therapy

## 2019-04-10 NOTE — Telephone Encounter (Signed)
Called office of referring physician, [name], DPM, to find out if he has a post-op protocol he would like this patent to follow and to request a copy by fax if he does. Spoke with Roselyn Reef in [place] who called over to [facility] but was unable to get a hold of him, so said she would send him a message. Provided our phone and fax numbers: P: [phone] I F: [place]. Graylon Good, PT, DPT 04/10/19, 10:58 AM

## 2019-04-16 ENCOUNTER — Ambulatory Visit: Payer: Commercial Managed Care - PPO | Attending: Podiatry | Admitting: Physical Therapy

## 2019-04-16 ENCOUNTER — Encounter: Payer: Self-pay | Admitting: Physical Therapy

## 2019-04-16 ENCOUNTER — Other Ambulatory Visit: Payer: Self-pay

## 2019-04-16 DIAGNOSIS — M79671 Pain in right foot: Secondary | ICD-10-CM

## 2019-04-16 DIAGNOSIS — R262 Difficulty in walking, not elsewhere classified: Secondary | ICD-10-CM

## 2019-04-16 DIAGNOSIS — M6281 Muscle weakness (generalized): Secondary | ICD-10-CM

## 2019-04-16 DIAGNOSIS — M25674 Stiffness of right foot, not elsewhere classified: Secondary | ICD-10-CM

## 2019-04-16 DIAGNOSIS — M25671 Stiffness of right ankle, not elsewhere classified: Secondary | ICD-10-CM

## 2019-04-16 NOTE — Therapy (Signed)
[facility] [address] [place], [address] Phone: [phone]   Fax:  [phone]  Physical Therapy Evaluation  Patient Details  Name: Brianna Odonnell MRN: [id number] Date of Birth: 1990/07/02 Referring Provider (PT): Rosetta Posner, DPM   Encounter Date: 04/16/2019  PT End of Session - 04/16/19 1646    Visit Number  1    Number of Visits  16    Date for PT Re-Evaluation  06/11/19    Authorization Type  Reporting period from 04/16/2019    Authorization - Visit Number  1    Authorization - Number of Visits  10    PT Start Time  1515    PT Stop Time  1610    PT Time Calculation (min)  55 min    Equipment Utilized During Treatment  --    Activity Tolerance  Patient tolerated treatment well;No increased pain    Behavior During Therapy  WFL for tasks assessed/performed       Past Medical History:  Diagnosis Date  . Anxiety   . Complication of anesthesia   . PONV (postoperative nausea and vomiting)     Past Surgical History:  Procedure Laterality Date  . MULTIPLE TOOTH EXTRACTIONS  2009  . OPEN REDUCTION INTERNAL FIXATION (ORIF) FOOT LISFRANC FRACTURE Right 02/20/2019   Procedure: LISFRANC;EACH JOINT X 2 RIGHT;  Surgeon: Rosetta Posner, DPM;  Location: [facility];  Service: Podiatry;  Laterality: Right;  GENERAL, POP/SAPH PREOP BLOCK UPREG  . TONSILLECTOMY    . UTERINE SEPTUM RESECTION  2019    There were no vitals filed for this visit.   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 She broke two bones in her foot (medial cuneiform and 2nd tarsal) and tore the lisfranc ligament. She had ORIF to place screws on 02/20/2019. She was nonweightbearing in a cast for 6 weeks and 2 weeks ago she was put in the walking boot and allowed to weight bear with  the boot on. She is a critical care nurse and would like to go back to work. She would like to be able to drive. Her doctor thinks it will be after the first of the year before she can go back to work. December 2nd is next follow up to schedule screw removal. Stiches for 2 weeks then post-op shoe. At work she does a lot of standing and quick walking and helping adults. 12 hour shifts 3x a week. She works in [employer]. She feels like her injury has been inconvenient. She has not been in a lot of pain. She was given some exercises by her physician to do over the last two weeks: alphabet, towel scrunches, dorsiflexion stretch with towel, isometric dorsiflexion, eversion, inversion.    Pertinent History  Patient is a 28 y.o. female who presents to outpatient physical therapy with a referral for medical diagnosis closed displced fracture of second metatarsal bone of R foot; post op R lisfranc ORIF. This patient's chief complaints consist of decreased function, pain, and stiffness of R foot leading to the following functional deficits: prolonged standing, showering, walking, cannot work, cannot drive, running, hiking, any activities requiring weight bearing of the R foot. Relevant past medical history and comorbidities include DENIES: brain, lung, diabetes, heart problems. .    Limitations  Standing;Walking;House hold activities;Other (comment)   Functional limitations: prolonged standing, showering, walking, cannot work, cannot drive, running, hiking, any activities requiring weight bearing of the R foot.   How long can you sit comfortably?  no limitations    How long can you stand comfortably?  25 min    How long can you walk comfortably?  takes longer and cannot take dogs on a walk.    Diagnostic tests  screw placement  excellent across medial cuneiform to second metatarsal.    Patient Stated Goals  She would like to go back to work and would like to be able to drive. Her doctor thinks it will be after the first  of the year before she can go back to work.    Currently in Pain?  No/denies    Pain Score  0-No pain   last pain was after procedure   Pain Location  Foot    Pain Orientation  Right   dorsal aspect   Pain Descriptors / Indicators  Other (Comment)   surgical pain, numbness   Pain Radiating Towards  does not radiate    Pain Onset  More than a month ago    Pain Frequency  Occasional    Aggravating Factors   bearing weight, mornings foot is very stiff, keeping it in one position for too long    Pain Relieving Factors  pain medications initially    Effect of Pain on Daily Activities  prolonged standing, showering, walking, cannot work, cannot drive, running, hiking, any activities requiring weight bearing of the R foot.       [facility] PT Assessment - 04/16/19 0001      Assessment   Medical Diagnosis  Closed displced fracture of second metatarsal bone of R foot; post op R lisfranc ORIF    Referring Provider (PT)  Rosetta Posner, DPM    Onset Date/Surgical Date  02/20/19   fractured 01/30/2019   Next MD Visit  04/30/2019    Prior Therapy  not for this problem prior to this episode of care      Precautions   Precautions  Other (comment)   no weight bearing out of the boot   Precaution Comments  From Physician: "still ambulate in boot at all times until hardware is removed. Work on range of motoin, scar massage/lostening, strengthening. Once hardware is removed, then work outside of the boot on strenght/ROM/gait training/proprioception."      Restrictions   Weight Bearing Restrictions  Yes    RLE Weight Bearing  --   weight bearing in boot only      Balance Screen   Has the patient fallen in the past 6 months  Yes    How many times?  2   fell with crutches   Has the patient had a decrease in activity level because of a fear of falling?   No    Is the patient reluctant to leave their home because of a fear of falling?   No      Home Public house manager residence     Chemical engineer;Other relatives    Home Access  --   no concerns at this point getting around her home     Prior Function   Level of Independence  Independent    Vocation  Full time employment    Vocation Requirements  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 12  hour shifts 3x a week. She works in     Leisure  traveling, going to the [place], read a lot, hang out with freinds and boyfreind, play with dogs      Cognition   Overall Cognitive Status  Within Functional Limits for tasks assessed      Observation/Other Assessments   Observations  see note from 04/16/2019 for leatest objective measurements    Focus on Therapeutic Outcomes (FOTO)   FOTO = 56       OBJECTIVE  OBSERVATION/INSPECTION . Tremor: none . Muscle bulk: generally WFL bilaterally . Skin: The incision sites appear to be healing well with no excessive redness, warmth, drainage or signs of infection present.   . Gait: grossly WFL for household and short community ambulation while wearing boot. More detailed gait analysis deferred to later date when boot removed.  . Figure 8: L = 50 cm; R = 50 cm.   PERIPHERAL JOINT MOTION (in degrees) *Indicates pain, R/L Date    Joint/Motion AROM PROM Comments  Ankle/Foot     Dorsiflexion (knee ext) 0/0 /10   Dorsiflexion (knee flex) 10/10 /10   Plantarflexion 60/76 /   Everison 20/35 /   Inversion 40/50 /   Great toe ext / 80/110 metatarsals to phalanges   Comments: mild pulling on right side with all motions. B hips and knees grossly WFL.   MUSCLE PERFORMANCE (MMT):  B hips, knees, equal and WFL L ankle 5/5 all directions R ankle deferred due to recent surgery  ACCESSORY MOTION:  - Deferred due to recent surgery  PALPATION: - Redness at incision sites, not TTP   FUNCTIONAL MOBILITY: - Bed mobility: WFL - Transfers: WFL while wearing cam boot  EDUCATION/COGNITION: Patient is alert and  oriented X 4.   Objective measurements completed on examination: See above findings.     TREATMENT:  S/P ORIF for R lisfranc fracture 02/20/2019. From Physician: "still ambulate in boot at all times until hardware is removed. Work on range of motoin, scar massage/loosening, strengthening. Once hardware is removed, then work outside of the boot on strenght/ROM/gait training/proprioception."   Therapeutic exercise: to centralize symptoms and improve ROM, strength, muscular endurance, and activity tolerance required for successful completion of functional activities.  -Toe Yoga: great toe extension with small toes flexion pressure into floor, repeated 3 sec holds; small toes extension with great toe flexion pressure into floor, repeated 3 sec holds. Ball of foot and heel maintains contact with floor. To improve intrinsic foot muscle activation and strength in order to better support arch and intrinsic foot structures. Patient with difficulty coordinating feet. Practiced 3 min each direction on R foot.  - Toe splays, repeated 3 sec holds. Ball of foot and heel maintains contact with floor. To improve intrinsic foot muscle activation and strength in order to better support arch and intrinsic foot structures. 10 reps on R foot.  - educated on scar massage and demonstrated how to perform. Patient practiced with supervision and feedback from physical therapist.  - Education on diagnosis, prognosis, POC, anatomy and physiology of current condition.  - Education on HEP including handout   HOME EXERCISE PROGRAM Access Code: [email or web address]  URL: [email or web address]  Date: 04/16/2019  Prepared by: Rosita Kea   Exercises  Seated Great Toe Extension - 2 sets - 20 reps - 3 seconds hold - 2 2 Minutes - 2x daily - 7x weekly  Seated Lesser Toes Extension - 2 sets - 20 reps - 3 seconds hold -  2 2 minutes - 2x daily - 7x weekly  Toe Spreading - 2 sets - 20 reps - 3 seconds hold - 2x daily - 7x weekly   Patient Education  Scar Massage    PT Education - 04/16/19 1645    Education Details  Exercise purpose/form. Self management techniques. Education on diagnosis, prognosis, POC, anatomy and physiology of current condition Education on HEP including handout    Person(s) Educated  Patient    Methods  Explanation;Demonstration;Tactile cues;Verbal cues;Handout    Comprehension  Verbalized understanding;Returned demonstration;Verbal cues required;Tactile cues required;Need further instruction       PT Short Term Goals - 04/16/19 1831      PT SHORT TERM GOAL #1   Title  Be independent with initial home exercise program for self-management of symptoms.    Baseline  Initial HEP provided at IE (04/16/2019);    Time  2    Period  Weeks    Status  New    Target Date  04/30/19        PT Long Term Goals - 04/16/19 1832      PT LONG TERM GOAL #1   Title  Be independent with a long-term home exercise program for self-management of symptoms    Baseline  Initial HEP provided at IE (04/16/2019);    Time  8    Period  Weeks    Status  New    Target Date  06/11/19      PT LONG TERM GOAL #2   Title  Patient will be able to return to driving and work (40/98/119111/28/2020)    Baseline  Currently unable to drive or work (47/82/956211/18/2020);    Time  0    Period  Weeks    Status  New    Target Date  06/11/19      PT LONG TERM GOAL #3   Title  Demonstrate improved FOTO score by 10 units to demonstrate improvement in overall condition and self-reported functional ability.    Baseline  FOTO = 56 (04/16/2019);    Time  0    Period  Weeks    Status  New    Target Date  06/11/19      PT LONG TERM GOAL #4   Title  Patient will demonstrate R ankle and great toe A/PROM equal or greater than left ankle and great toe to improve ability to ambulate with normal gait pattern and return to PLOF running, stairs, walking.    Baseline  see objective data (04/16/2019);    Time  8    Period  Weeks    Status  New     Target Date  06/11/19      PT LONG TERM GOAL #5   Title  Patient will demonstrate equal or greater than 4+/5 strength in all directions of R ankle to improve ability to complete weight bearing activities safely.    Baseline  not tested on R due to surgical precautions.    Time  8    Period  Weeks    Status  New    Target Date  06/11/19      Additional Long Term Goals   Additional Long Term Goals  Yes      PT LONG TERM GOAL #6   Title  Complete community, work and/or recreational activities without limitation due to current condition.    Baseline  prolonged standing, showering, walking, cannot work, cannot drive, running, hiking, any activities requiring weight bearing of the  R foot.(04/16/2019);    Time  8    Period  Weeks    Status  New    Target Date  06/11/19         Plan - 04/16/19 1824    Clinical Impression Statement  Patient is a 29 y.o. female referred to outpatient physical therapy with a medical diagnosis of closed displced fracture of second metatarsal bone of R foot; post op R lisfranc ORIF performed 02/20/2019 who presents with signs and symptoms consistent with R foot pain, foot and ankle stiffness, altered gait pattern, and generalized weakness following R lisfranc fracture/dislocation and subsequent ORIF 02/20/2019. Patient presents with significant stiffness, ROM, strength, coordination, stability, proprioceptive, activity tolerance, balance, gait, and muscle performance impairments that are limiting ability to complete all activities requiring R LE weight bearing including prolonged standing, showering, walking, working, driving, running, hiking without difficulty. Patient will benefit from skilled physical therapy intervention to address current body structure impairments and activity limitations to improve function and work towards goals set in current POC in order to return to prior level of function or maximal functional improvement    Personal Factors and Comorbidities   Fitness;Past/Current Experience;Time since onset of injury/illness/exacerbation    Examination-Activity Limitations  Squat;Stairs;Lift;Locomotion Level;Stand;Caring for Others;Carry;Transfers   prolonged standing, showering, walking, cannot work, cannot drive, running, hiking, any activities requiring weight bearing of the R foot.   Examination-Participation Restrictions  Cleaning;Shop;Yard Work;Community Activity;Driving;Interpersonal Relationship   work   Stability/Clinical Decision Making  Stable/Uncomplicated    Clinical Decision Making  Low    Rehab Potential  Excellent    PT Frequency  2x / week    PT Duration  8 weeks    PT Treatment/Interventions  ADLs/Self Care Home Management;Biofeedback;Cryotherapy;Electrical Stimulation;Moist Heat;Gait training;Stair training;Functional mobility training;Therapeutic activities;Therapeutic exercise;Balance training;Neuromuscular re-education;Patient/family education;Manual techniques;Dry needling;Taping;Passive range of motion;Scar mobilization;Spinal Manipulations;Joint Manipulations    PT Next Visit Plan  progress ROM, strengtheing, proprioception/motor control, scar mobility as tolerated    PT Home Exercise Plan  Medbridge Access Code: [email or web address]    Consulted and Agree with Plan of Care  Patient       Patient will benefit from skilled therapeutic intervention in order to improve the following deficits and impairments:  Abnormal gait, Decreased balance, Decreased endurance, Decreased mobility, Decreased skin integrity, Difficulty walking, Hypomobility, Increased muscle spasms, Decreased knowledge of precautions, Decreased range of motion, Decreased scar mobility, Increased edema, Impaired perceived functional ability, Improper body mechanics, Decreased activity tolerance, Decreased coordination, Decreased strength, Increased fascial restricitons, Impaired flexibility, Pain  Visit Diagnosis: Pain in right foot  Stiffness of right foot, not elsewhere  classified  Stiffness of right ankle, not elsewhere classified  Muscle weakness (generalized)  Difficulty in walking, not elsewhere classified     Problem List There are no active problems to display for this patient.   Luretha Murphy. Ilsa Iha, PT, DPT 04/16/19, 6:39 PM  [facility]IC[facility] [address] [place], [address] Phone: [phone]   Fax:  [phone]  Name: Brianna Odonnell MRN: [id number] Date of Birth: February 25, 1991

## 2019-04-21 ENCOUNTER — Ambulatory Visit: Payer: Commercial Managed Care - PPO | Admitting: Physical Therapy

## 2019-04-22 ENCOUNTER — Encounter: Payer: Self-pay | Admitting: Physical Therapy

## 2019-04-22 ENCOUNTER — Ambulatory Visit: Payer: Commercial Managed Care - PPO | Admitting: Physical Therapy

## 2019-04-22 ENCOUNTER — Other Ambulatory Visit: Payer: Self-pay

## 2019-04-22 DIAGNOSIS — M25671 Stiffness of right ankle, not elsewhere classified: Secondary | ICD-10-CM

## 2019-04-22 DIAGNOSIS — M79671 Pain in right foot: Secondary | ICD-10-CM | POA: Diagnosis not present

## 2019-04-22 DIAGNOSIS — M25674 Stiffness of right foot, not elsewhere classified: Secondary | ICD-10-CM

## 2019-04-22 DIAGNOSIS — R262 Difficulty in walking, not elsewhere classified: Secondary | ICD-10-CM

## 2019-04-22 DIAGNOSIS — M6281 Muscle weakness (generalized): Secondary | ICD-10-CM

## 2019-04-22 NOTE — Therapy (Signed)
[facility] [address] [place], [address] Phone: [phone]   Fax:  [phone]  Physical Therapy Treatment  Patient Details  Name: Brianna Odonnell MRN: [id number] Date of Birth: 01-07-1991 Referring Provider (PT): Rosetta Posner, DPM   Encounter Date: 04/22/2019  PT End of Session - 04/22/19 1457    Visit Number  2    Number of Visits  16    Date for PT Re-Evaluation  06/11/19    Authorization Type  Reporting period from 04/16/2019    Authorization - Visit Number  2    Authorization - Number of Visits  10    PT Start Time  1303    PT Stop Time  1348    PT Time Calculation (min)  45 min    Activity Tolerance  Patient tolerated treatment well;No increased pain    Behavior During Therapy  WFL for tasks assessed/performed       Past Medical History:  Diagnosis Date  . Anxiety   . Complication of anesthesia   . PONV (postoperative nausea and vomiting)     Past Surgical History:  Procedure Laterality Date  . MULTIPLE TOOTH EXTRACTIONS  2009  . OPEN REDUCTION INTERNAL FIXATION (ORIF) FOOT LISFRANC FRACTURE Right 02/20/2019   Procedure: LISFRANC;EACH JOINT X 2 RIGHT;  Surgeon: Rosetta Posner, DPM;  Location: [facility];  Service: Podiatry;  Laterality: Right;  GENERAL, POP/SAPH PREOP BLOCK UPREG  . TONSILLECTOMY    . UTERINE SEPTUM RESECTION  2019    There were no vitals filed for this visit.  Subjective Assessment - 04/22/19 1455    Subjective  Patient states she is feeling well today and she has no pain upon arrival. She has been practicing her HEP as prescribed and feels like her toe yoga is getting better, although she is concerned that her R 2nd and 3rd toes are not seperating on the R foot as they are on the left. Reports no excessive soreness or pain following last treatment session.    Pertinent History  Patient is a 28 y.o. female who presents to outpatient physical therapy with a  referral for medical diagnosis closed displced fracture of second metatarsal bone of R foot; post op R lisfranc ORIF. This patient's chief complaints consist of decreased function, pain, and stiffness of R foot leading to the following functional deficits: prolonged standing, showering, walking, cannot work, cannot drive, running, hiking, any activities requiring weight bearing of the R foot. Relevant past medical history and comorbidities include DENIES: brain, lung, diabetes, heart problems. .    Limitations  Standing;Walking;House hold activities;Other (comment)   Functional limitations: prolonged standing, showering, walking, cannot work, cannot drive, running, hiking, any activities requiring weight bearing of the R foot.   How long can you sit comfortably?  no limitations    How long can you stand comfortably?  25 min    How long can you walk comfortably?  takes longer and cannot take dogs on a walk.    Diagnostic tests  screw placement  excellent across medial cuneiform to second metatarsal.    Patient Stated Goals  She would like to go back to work and would like to be able to drive. Her doctor thinks it will be after the first of the year before she can go back to work.    Currently in Pain?  No/denies    Pain Onset  More than a month ago  TREATMENT:  S/P ORIF for R lisfranc fracture 02/20/2019. From Physician: "still ambulate in boot at all times until hardware is removed. Work on range of motonn, scar massage/loosening, strengthening. Once hardware is removed, then work outside of the boot on strenght/ROM/gait training/proprioception."   Therapeutic exercise:to centralize symptoms and improve ROM, strength, muscular endurance, and activity tolerance required for successful completion of functional activities.  -Toe Yoga: great toe extension with small toes flexion pressure into floor, repeated 3 sec holds; small toes extension with great toe flexion pressure into floor, repeated 3  sec holds. Ball of foot and heel maintains contact with floor. To improve intrinsic foot muscle activation and strength in order to better support arch and intrinsic foot structures. Patient with difficulty coordinating feet. Practiced 2 min each direction on B feet.  - Toe splays, repeated 3 sec holds. Ball of foot and heel maintains contact with floor. To improve intrinsic foot muscle activation and strength in order to better support arch and intrinsic foot structures. 2 min both feet.  - PROM great toe extension, self stretch, 5 second holds x 20. Initially PROM with therapist overpressure to teach technique. Avoided dorsiflexion to decrease stress over lisfranc junction.  - Seated left ankle BAPS board, level 2, plantarflexion/dorsiflexion, inversion/eversion, circles clockwise, circles counter clockwise, 2 min each plus time for instruction and transitions. Pt required cuing to keep knee still and instruction on how to perform exercise. Recommend higher level next session.  - marble pick up with R toes: floor to top of cone x 10 marbles. Patient with fair to good dexterity.  - R ankle 4-way isotonic strengthening against single yellow band, x 20 each direction. Mild discomfort felt in eversion and dorsiflexion during motion that resolved immediately with rest. Educated on acceptable discomfort level and appropriate response should it be more painful. - Education on HEP including handout  Cuing for form, purpose, and updated HEP throughout session.    Manual therapy: to reduce pain and tissue tension, improve range of motion, neuromodulation, in order to promote improved ability to complete functional activities. - scar massage over incision sites, more pliable and making good progress.  - longsitting R talocrual AP glide grade II-IV to improve dorsiflexion at this joint. Careful to avoid stress over ORIF site.   HOME EXERCISE PROGRAM Access Code: [email or web address]  URL:  [email or web address]  Date: 04/22/2019  Prepared by: Norton Blizzard   Exercises Seated Great Toe Extension - 2 sets - 20 reps - 3 seconds hold - 2 2 Minutes - 2x daily - 7x weekly Seated Lesser Toes Extension - 2 sets - 20 reps - 3 seconds hold - 2 2 minutes - 2x daily - 7x weekly Toe Spreading - 2 sets - 20 reps - 3 seconds hold - 2x daily - 7x weekly Ankle Inversion with Resistance - 20 reps - 1x daily Ankle Dorsiflexion with Resistance - 20 reps - 1x daily Ankle Plantar Flexion with Resistance - 20 reps - 1x daily Ankle Eversion with Resistance - 20 reps - 1x daily Patient Education Scar Massage    PT Education - 04/22/19 1457    Education Details  Exercise purpose/form. Self management techniques. Education on anatomy and physiology and healing process.    Person(s) Educated  Patient    Methods  Explanation;Demonstration;Tactile cues;Verbal cues;Handout    Comprehension  Verbalized understanding;Returned demonstration;Verbal cues required;Need further instruction;Tactile cues required       PT Short Term Goals - 04/22/19 1458      PT SHORT TERM  GOAL #1   Title  Be independent with initial home exercise program for self-management of symptoms.    Baseline  Initial HEP provided at IE (04/16/2019);    Time  2    Period  Weeks    Status  Achieved    Target Date  04/30/19        PT Long Term Goals - 04/16/19 1832      PT LONG TERM GOAL #1   Title  Be independent with a long-term home exercise program for self-management of symptoms    Baseline  Initial HEP provided at IE (04/16/2019);    Time  8    Period  Weeks    Status  New    Target Date  06/11/19      PT LONG TERM GOAL #2   Title  Patient will be able to return to driving and work (16/10/960411/28/2020)    Baseline  Currently unable to drive or work (54/09/811911/18/2020);    Time  0    Period  Weeks    Status  New    Target Date  06/11/19      PT LONG TERM GOAL #3   Title  Demonstrate improved FOTO score by 10  units to demonstrate improvement in overall condition and self-reported functional ability.    Baseline  FOTO = 56 (04/16/2019);    Time  0    Period  Weeks    Status  New    Target Date  06/11/19      PT LONG TERM GOAL #4   Title  Patient will demonstrate R ankle and great toe A/PROM equal or greater than left ankle and great toe to improve ability to ambulate with normal gait pattern and return to PLOF running, stairs, walking.    Baseline  see objective data (04/16/2019);    Time  8    Period  Weeks    Status  New    Target Date  06/11/19      PT LONG TERM GOAL #5   Title  Patient will demonstrate equal or greater than 4+/5 strength in all directions of R ankle to improve ability to complete weight bearing activities safely.    Baseline  not tested on R due to surgical precautions.    Time  8    Period  Weeks    Status  New    Target Date  06/11/19      Additional Long Term Goals   Additional Long Term Goals  Yes      PT LONG TERM GOAL #6   Title  Complete community, work and/or recreational activities without limitation due to current condition.    Baseline  prolonged standing, showering, walking, cannot work, cannot drive, running, hiking, any activities requiring weight bearing of the R foot.(04/16/2019);    Time  8    Period  Weeks    Status  New    Target Date  06/11/19            Plan - 04/22/19 1505    Clinical Impression Statement  Pt tolerated treatment well and is making good progress towards goals at this point. Pt was able to complete all exercises with minimal to no lasting increase in pain or discomfort. She was able to advance to gentle isotonic strengthening and her HEP was updated accordingly. Demonstrates good improvement in intrinsic foot musculature coordination and great toe extension ROM. Pt required multimodal cuing for proper technique and to facilitate improved neuromuscular  control, strength, range of motion, and functional ability resulting in  improved performance and form.Patient would benefit from continued physical therapy to address remaining impairments and functional limitations to work towards stated goals and return to PLOF or maximal functional independence.    Personal Factors and Comorbidities  Fitness;Past/Current Experience;Time since onset of injury/illness/exacerbation    Examination-Activity Limitations  Squat;Stairs;Lift;Locomotion Level;Stand;Caring for Others;Carry;Transfers   prolonged standing, showering, walking, cannot work, cannot drive, running, hiking, any activities requiring weight bearing of the R foot.   Examination-Participation Restrictions  Cleaning;Shop;Yard Work;Community Activity;Driving;Interpersonal Relationship   work   Stability/Clinical Decision Making  Stable/Uncomplicated    Rehab Potential  Excellent    PT Frequency  2x / week    PT Duration  8 weeks    PT Treatment/Interventions  ADLs/Self Care Home Management;Biofeedback;Cryotherapy;Electrical Stimulation;Moist Heat;Gait training;Stair training;Functional mobility training;Therapeutic activities;Therapeutic exercise;Balance training;Neuromuscular re-education;Patient/family education;Manual techniques;Dry needling;Taping;Passive range of motion;Scar mobilization;Spinal Manipulations;Joint Manipulations    PT Next Visit Plan  progress ROM, strengtheing, proprioception/motor control, scar mobility as tolerated; hip strengthening    PT Home Exercise Plan  Medbridge Access Code: [email or web address]    Consulted and Agree with Plan of Care  Patient       Patient will benefit from skilled therapeutic intervention in order to improve the following deficits and impairments:  Abnormal gait, Decreased balance, Decreased endurance, Decreased mobility, Decreased skin integrity, Difficulty walking, Hypomobility, Increased muscle spasms, Decreased knowledge of precautions, Decreased range of motion, Decreased scar mobility, Increased edema, Impaired perceived  functional ability, Improper body mechanics, Decreased activity tolerance, Decreased coordination, Decreased strength, Increased fascial restricitons, Impaired flexibility, Pain  Visit Diagnosis: Pain in right foot  Stiffness of right foot, not elsewhere classified  Stiffness of right ankle, not elsewhere classified  Muscle weakness (generalized)  Difficulty in walking, not elsewhere classified     Problem List There are no active problems to display for this patient.   Everlean Alstrom. Graylon Good, PT, DPT 04/22/19, 3:06 PM  [facility] [address], [place], [address] Phone: [phone]   Fax:  [phone]  Name: Brianna Odonnell MRN: [id number] Date of Birth: Mar 18, 1991

## 2019-04-23 ENCOUNTER — Encounter: Payer: Self-pay | Admitting: Physical Therapy

## 2019-04-23 ENCOUNTER — Ambulatory Visit: Payer: Commercial Managed Care - PPO | Admitting: Physical Therapy

## 2019-04-23 DIAGNOSIS — R262 Difficulty in walking, not elsewhere classified: Secondary | ICD-10-CM

## 2019-04-23 DIAGNOSIS — M25671 Stiffness of right ankle, not elsewhere classified: Secondary | ICD-10-CM

## 2019-04-23 DIAGNOSIS — M79671 Pain in right foot: Secondary | ICD-10-CM

## 2019-04-23 DIAGNOSIS — M25674 Stiffness of right foot, not elsewhere classified: Secondary | ICD-10-CM

## 2019-04-23 DIAGNOSIS — M6281 Muscle weakness (generalized): Secondary | ICD-10-CM

## 2019-04-23 NOTE — Therapy (Signed)
[facility] [address] [place], [place], [address] Phone: [phone]   Fax:  [phone]  Physical Therapy Treatment  Patient Details  Name: Brianna Odonnell MRN: [id number] Date of Birth: 13-Sep-1990 Referring Provider (PT): Rosetta PosnerAndrew Baker, DPM   Encounter Date: 04/23/2019  PT End of Session - 04/23/19 1741    Visit Number  3    Number of Visits  16    Date for PT Re-Evaluation  06/11/19    Authorization Type  Reporting period from 04/16/2019    Authorization - Visit Number  3    Authorization - Number of Visits  10    PT Start Time  1035    PT Stop Time  1115    PT Time Calculation (min)  40 min    Activity Tolerance  Patient tolerated treatment well    Behavior During Therapy  [facility] for tasks assessed/performed       Past Medical History:  Diagnosis Date  . Anxiety   . Complication of anesthesia   . PONV (postoperative nausea and vomiting)     Past Surgical History:  Procedure Laterality Date  . MULTIPLE TOOTH EXTRACTIONS  2009  . OPEN REDUCTION INTERNAL FIXATION (ORIF) FOOT LISFRANC FRACTURE Right 02/20/2019   Procedure: LISFRANC;EACH JOINT X 2 RIGHT;  Surgeon: Rosetta PosnerBaker, Andrew, DPM;  [facility];  Service: Podiatry;  Laterality: Right;  GENERAL, POP/SAPH PREOP BLOCK UPREG  . TONSILLECTOMY    . UTERINE SEPTUM RESECTION  2019    There were no vitals filed for this visit.  Subjective Assessment - 04/23/19 1733    Subjective  Patient report she is feeling well and is not having any pain today, just a bit of soreness. She did her HEP again last evening and her foot was sore but not painful.    Pertinent History  Patient is a 28 y.o. female who presents to outpatient physical therapy with a referral for medical diagnosis closed displced fracture of second metatarsal bone of R foot; post op R lisfranc ORIF. This patient's chief complaints consist of decreased function, pain, and stiffness of  R foot leading to the following functional deficits: prolonged standing, showering, walking, cannot work, cannot drive, running, hiking, any activities requiring weight bearing of the R foot. Relevant past medical history and comorbidities include DENIES: brain, lung, diabetes, heart problems. .    Limitations  Standing;Walking;House hold activities;Other (comment)   Functional limitations: prolonged standing, showering, walking, cannot work, cannot drive, running, hiking, any activities requiring weight bearing of the R foot.   How long can you sit comfortably?  no limitations    How long can you stand comfortably?  25 min    How long can you walk comfortably?  takes longer and cannot take dogs on a walk.    Diagnostic tests  screw placement  excellent across medial cuneiform to second metatarsal.    Patient Stated Goals  She would like to go back to work and would like to be able to drive. Her doctor thinks it will be after the first of the year before she can go back to work.    Currently in Pain?  No/denies    Pain Onset  More than a month ago       TREATMENT: S/P ORIF for R lisfranc fracture 02/20/2019. From Physician:"still ambulate in boot at all times until hardware is removed. Work on range of motonn, scar massage/loosening, strengthening. Once hardware is removed, then  work outside of the boot on strenght/ROM/gait training/proprioception."  Therapeutic exercise:to centralize symptoms and improve ROM, strength, muscular endurance, and activity tolerance required for successful completion of functional activities.  Seated exercises: - Seated R ankle BAPS board, level 4, plantarflexion/dorsiflexion, inversion/eversion, circles clockwise, circles counter clockwise, 2 min each plus time for instruction and transitions. Pt required cuing to keep knee still but good carry over from prior session. Recommend weight peg next session.  Mat exercises:  - sidelying hip abduction x 20 each  side no weight. 2x10 each side with 5# ankle weights.  - double leg bridge x 7 (boot donned). No pain - single leg bridge (boot donned). 2x10 each side. No pain Verbal, tactile, and visual cuing for proper form, strong glute contraction.   Standing exercises:  - Squats with theraband wrapped around knees for isometric abduction to reduce knee valgus, chair behind to improve confidence and reduce fear of falling backwards while working on hip hinge. Cuing for hip hinge, equal weightbearing, neutral to mild genuvarum in bilateral knees, keeping tibias perpendicular to floor to reduce forward translation of knees during movement.   5x30 second holds with cuing for glute activation.  - single leg stance with ball toss.  2x10 each side. With second foot touch down support as needed. (one set of 10 with bilateral LE support to get used to how the ball feels when tossed). 2kg ball. SBA for safety. Cuing for technique.   - step up to 12 inch step x 10 R foot leading, then 2x10 each side to 8 inch step. Cuing for full hip and knee extension to improve quad and glute activation coming up, as well as gentle landing coming down.   HOME EXERCISE PROGRAM Access Code: [email or web address]      URL: [email or web address]    Date: 04/22/2019  Prepared by: Norton Blizzard   Exercises Seated Great Toe Extension - 2 sets - 20 reps - 3 seconds hold - 2 2 Minutes - 2x daily - 7x weekly Seated Lesser Toes Extension - 2 sets - 20 reps - 3 seconds hold - 2 2 minutes - 2x daily - 7x weekly Toe Spreading - 2 sets - 20 reps - 3 seconds hold - 2x daily - 7x weekly Ankle Inversion with Resistance - 20 reps - 1x daily Ankle Dorsiflexion with Resistance - 20 reps - 1x daily Ankle Plantar Flexion with Resistance - 20 reps - 1x daily Ankle Eversion with Resistance - 20 reps - 1x daily Patient Education Scar Massage    PT Education - 04/23/19 1741    Education Details  Exercise purpose/form. Self management techniques.     Person(s) Educated  Patient    Methods  Explanation;Demonstration;Tactile cues;Verbal cues    Comprehension  Verbalized understanding;Returned demonstration;Verbal cues required;Tactile cues required;Need further instruction       PT Short Term Goals - 04/22/19 1458      PT SHORT TERM GOAL #1   Title  Be independent with initial home exercise program for self-management of symptoms.    Baseline  Initial HEP provided at IE (04/16/2019);    Time  2    Period  Weeks    Status  Achieved    Target Date  04/30/19        PT Long Term Goals - 04/16/19 1832      PT LONG TERM GOAL #1   Title  Be independent with a long-term home exercise program for self-management of symptoms    Baseline  Initial HEP  provided at IE (04/16/2019);    Time  8    Period  Weeks    Status  New    Target Date  06/11/19      PT LONG TERM GOAL #2   Title  Patient will be able to return to driving and work (69/62/9528)    Baseline  Currently unable to drive or work (41/32/4401);    Time  0    Period  Weeks    Status  New    Target Date  06/11/19      PT LONG TERM GOAL #3   Title  Demonstrate improved FOTO score by 10 units to demonstrate improvement in overall condition and self-reported functional ability.    Baseline  FOTO = 56 (04/16/2019);    Time  0    Period  Weeks    Status  New    Target Date  06/11/19      PT LONG TERM GOAL #4   Title  Patient will demonstrate R ankle and great toe A/PROM equal or greater than left ankle and great toe to improve ability to ambulate with normal gait pattern and return to PLOF running, stairs, walking.    Baseline  see objective data (04/16/2019);    Time  8    Period  Weeks    Status  New    Target Date  06/11/19      PT LONG TERM GOAL #5   Title  Patient will demonstrate equal or greater than 4+/5 strength in all directions of R ankle to improve ability to complete weight bearing activities safely.    Baseline  not tested on R due to surgical  precautions.    Time  8    Period  Weeks    Status  New    Target Date  06/11/19      Additional Long Term Goals   Additional Long Term Goals  Yes      PT LONG TERM GOAL #6   Title  Complete community, work and/or recreational activities without limitation due to current condition.    Baseline  prolonged standing, showering, walking, cannot work, cannot drive, running, hiking, any activities requiring weight bearing of the R foot.(04/16/2019);    Time  8    Period  Weeks    Status  New    Target Date  06/11/19            Plan - 04/23/19 1747    Clinical Impression Statement  Patient tolerated treatment well and is making towards goals at this point. Hip, knee, and functional strengthening focus of today's session to prepare for advances in weight bearing as patient's condition improves. Introduced balance exercises to improve proprioception. Patient had fleeting discomfort and soreness that resolved with rest and voiced comfort with continuing all exercises performed today. Did not add to HEP in order to prevent overwhelming her with exercises at home but gave her the option to perform any that she wishes to improve her readiness for return to [facility] when she gets out of the boot. Pt required multimodal cuing for proper technique and to facilitate improved neuromuscular control, strength, range of motion, and functional ability resulting in improved performance and form.Patient would benefit from continued physical therapy to address remaining impairments and functional limitations to work towards stated goals and return to PLOF or maximal functional independence.    Personal Factors and Comorbidities  Fitness;Past/Current Experience;Time since onset of injury/illness/exacerbation    Examination-Activity Limitations  Squat;Stairs;Lift;Locomotion  Level;Stand;Caring for Others;Carry;Transfers   prolonged standing, showering, walking, cannot work, cannot drive, running, hiking, any activities  requiring weight bearing of the R foot.   Examination-Participation Restrictions  Cleaning;Shop;Yard Work;Community Activity;Driving;Interpersonal Relationship   work   Stability/Clinical Decision Making  Stable/Uncomplicated    Rehab Potential  Excellent    PT Frequency  2x / week    PT Duration  8 weeks    PT Treatment/Interventions  ADLs/Self Care Home Management;Biofeedback;Cryotherapy;Electrical Stimulation;Moist Heat;Gait training;Stair training;Functional mobility training;Therapeutic activities;Therapeutic exercise;Balance training;Neuromuscular re-education;Patient/family education;Manual techniques;Dry needling;Taping;Passive range of motion;Scar mobilization;Spinal Manipulations;Joint Manipulations    PT Next Visit Plan  progress ROM, strengtheing, proprioception/motor control, scar mobility as tolerated; hip strengthening    PT Home Exercise Plan  Medbridge Access Code: [email or web address]    Consulted and Agree with Plan of Care  Patient       Patient will benefit from skilled therapeutic intervention in order to improve the following deficits and impairments:  Abnormal gait, Decreased balance, Decreased endurance, Decreased mobility, Decreased skin integrity, Difficulty walking, Hypomobility, Increased muscle spasms, Decreased knowledge of precautions, Decreased range of motion, Decreased scar mobility, Increased edema, Impaired perceived functional ability, Improper body mechanics, Decreased activity tolerance, Decreased coordination, Decreased strength, Increased fascial restricitons, Impaired flexibility, Pain  Visit Diagnosis: Pain in right foot  Stiffness of right foot, not elsewhere classified  Stiffness of right ankle, not elsewhere classified  Muscle weakness (generalized)  Difficulty in walking, not elsewhere classified     Problem List There are no active problems to display for this patient.   Everlean Alstrom. Graylon Good, PT, DPT 04/23/19, 5:48 PM  [facility] AND SPOR[facility] [address], [place], [address] Phone: [phone]   Fax:  [phone]  Name: Brianna Odonnell MRN: [id number] Date of Birth: 01-20-1991

## 2019-04-28 ENCOUNTER — Other Ambulatory Visit: Payer: Self-pay

## 2019-04-28 ENCOUNTER — Encounter: Payer: Self-pay | Admitting: Physical Therapy

## 2019-04-28 ENCOUNTER — Ambulatory Visit: Payer: Commercial Managed Care - PPO | Admitting: Physical Therapy

## 2019-04-28 DIAGNOSIS — M25671 Stiffness of right ankle, not elsewhere classified: Secondary | ICD-10-CM

## 2019-04-28 DIAGNOSIS — M79671 Pain in right foot: Secondary | ICD-10-CM | POA: Diagnosis not present

## 2019-04-28 DIAGNOSIS — M25674 Stiffness of right foot, not elsewhere classified: Secondary | ICD-10-CM

## 2019-04-28 DIAGNOSIS — M6281 Muscle weakness (generalized): Secondary | ICD-10-CM

## 2019-04-28 DIAGNOSIS — R262 Difficulty in walking, not elsewhere classified: Secondary | ICD-10-CM

## 2019-04-28 NOTE — Therapy (Signed)
[facility] MEDIC[facility] [address] [place], [place], [address] Phone: [phone]   Fax:  [phone]  Physical Therapy Treatment  Patient Details  Name: Brianna Odonnell MRN: [id number] Date of Birth: 1990-06-04 Referring Provider (PT): Rosetta PosnerAndrew Baker, DPM   Encounter Date: 04/28/2019  PT End of Session - 04/28/19 2006    Visit Number  4    Number of Visits  16    Date for PT Re-Evaluation  06/11/19    Authorization Type  Reporting period from 04/16/2019    Authorization - Visit Number  4    Authorization - Number of Visits  10    PT Start Time  1520    PT Stop Time  1600    PT Time Calculation (min)  40 min    Activity Tolerance  Patient tolerated treatment well    Behavior During Therapy  [facility] for tasks assessed/performed       Past Medical History:  Diagnosis Date  . Anxiety   . Complication of anesthesia   . PONV (postoperative nausea and vomiting)     Past Surgical History:  Procedure Laterality Date  . MULTIPLE TOOTH EXTRACTIONS  2009  . OPEN REDUCTION INTERNAL FIXATION (ORIF) FOOT LISFRANC FRACTURE Right 02/20/2019   Procedure: LISFRANC;EACH JOINT X 2 RIGHT;  Surgeon: Rosetta PosnerBaker, Andrew, DPM;  [facility];  Service: Podiatry;  Laterality: Right;  GENERAL, POP/SAPH PREOP BLOCK UPREG  . TONSILLECTOMY    . UTERINE SEPTUM RESECTION  2019    There were no vitals filed for this visit.  Subjective Assessment - 04/28/19 1525    Subjective  Patient reports she is feeling okay today. She has no pain upon arrival. She notices some pain at the lateral dorsal foot with dorsiflexion and inversion but it stops when she moves out of this position. She denies any soreness in her hips following last session. Has some sorenes after using the bands.    Pertinent History  Patient is a 28 y.o. female who presents to outpatient physical therapy with a referral for medical diagnosis closed displced fracture of second  metatarsal bone of R foot; post op R lisfranc ORIF. This patient's chief complaints consist of decreased function, pain, and stiffness of R foot leading to the following functional deficits: prolonged standing, showering, walking, cannot work, cannot drive, running, hiking, any activities requiring weight bearing of the R foot. Relevant past medical history and comorbidities include DENIES: brain, lung, diabetes, heart problems. .    Limitations  Standing;Walking;House hold activities;Other (comment)   Functional limitations: prolonged standing, showering, walking, cannot work, cannot drive, running, hiking, any activities requiring weight bearing of the R foot.   How long can you sit comfortably?  no limitations    How long can you stand comfortably?  25 min    How long can you walk comfortably?  takes longer and cannot take dogs on a walk.    Diagnostic tests  screw placement  excellent across medial cuneiform to second metatarsal.    Patient Stated Goals  She would like to go back to work and would like to be able to drive. Her doctor thinks it will be after the first of the year before she can go back to work.    Currently in Pain?  No/denies    Pain Onset  More than a month ago       TREATMENT: S/P ORIF for R lisfranc fracture 02/20/2019. From Physician:"still ambulate in boot  at all times until hardware is removed. Work on range of motonn, scar massage/loosening, strengthening. Once hardware is removed, then work outside of the boot on strenght/ROM/gait training/proprioception."  Therapeutic exercise:to centralize symptoms and improve ROM, strength, muscular endurance, and activity tolerance required for successful completion of functional activities.  Seated exercises: -Seated R ankle BAPS board, level 4 with peg at lateral hole. plantarflexion/dorsiflexion, inversion/eversion, circles clockwise, circles counter clockwise, 2 min each plus time for instruction and transitions. Pt  required cuing to keep knee still but good carry over from prior session.Recommend weight peg next session. - arch raises R foot to improve intrinsic foot activation and coordination in preparation for increased weight bearing activities out of the boot. 2x 2 min with 3 second holds. Mild discomfort.  - great toe flexion against red theraband while foot is on floor 2x15. To prepare for push off.     Standing exercises:  - single leg stance with ball toss.  3x10 each side. With second foot touch down support as needed. 2kg ball. SBA for safety. Cuing for technique.   Manual therapy: to reduce pain and tissue tension, improve range of motion, neuromodulation, in order to promote improved ability to complete functional activities. - cross friction scar massage over incisions to decrease adhesions - STM to peroneus longus, brevis, tertius and surrounding soft tissue to decrease discomfort and improve extensibility .  HOME EXERCISE PROGRAM Access Code: [email or web address]  URL: [email or web address]  Date: 04/28/2019  Prepared by: Norton Blizzard   Exercises Seated Great Toe Extension - 2 sets - 20 reps - 3 seconds hold - 2 2 Minutes - 2x daily - 7x weekly Seated Lesser Toes Extension - 2 sets - 20 reps - 3 seconds hold - 2 2 minutes - 2x daily - 7x weekly Toe Spreading - 2 sets - 20 reps - 3 seconds hold - 2x daily - 7x weekly Arch Lifting - 2 sets - 15 reps - 3 seconds hold - 1-2x daily Ankle Inversion with Resistance - 20 reps - 1x daily Ankle Dorsiflexion with Resistance - 20 reps - 1x daily Ankle Plantar Flexion with Resistance - 20 reps - 1x daily Ankle Eversion with Resistance - 20 reps - 1x daily Patient Education Scar Massage   PT Education - 04/28/19 2006    Education Details  Exercise purpose/form. Self management techniques    Person(s) Educated  Patient    Methods  Explanation;Demonstration;Tactile cues;Verbal cues    Comprehension  Verbalized understanding;Returned  demonstration;Verbal cues required;Need further instruction;Tactile cues required       PT Short Term Goals - 04/22/19 1458      PT SHORT TERM GOAL #1   Title  Be independent with initial home exercise program for self-management of symptoms.    Baseline  Initial HEP provided at IE (04/16/2019);    Time  2    Period  Weeks    Status  Achieved    Target Date  04/30/19        PT Long Term Goals - 04/16/19 1832      PT LONG TERM GOAL #1   Title  Be independent with a long-term home exercise program for self-management of symptoms    Baseline  Initial HEP provided at IE (04/16/2019);    Time  8    Period  Weeks    Status  New    Target Date  06/11/19      PT LONG TERM GOAL #2   Title  Patient will be able  to return to driving and work (75/64/3329)    Baseline  Currently unable to drive or work (51/88/4166);    Time  0    Period  Weeks    Status  New    Target Date  06/11/19      PT LONG TERM GOAL #3   Title  Demonstrate improved FOTO score by 10 units to demonstrate improvement in overall condition and self-reported functional ability.    Baseline  FOTO = 56 (04/16/2019);    Time  0    Period  Weeks    Status  New    Target Date  06/11/19      PT LONG TERM GOAL #4   Title  Patient will demonstrate R ankle and great toe A/PROM equal or greater than left ankle and great toe to improve ability to ambulate with normal gait pattern and return to PLOF running, stairs, walking.    Baseline  see objective data (04/16/2019);    Time  8    Period  Weeks    Status  New    Target Date  06/11/19      PT LONG TERM GOAL #5   Title  Patient will demonstrate equal or greater than 4+/5 strength in all directions of R ankle to improve ability to complete weight bearing activities safely.    Baseline  not tested on R due to surgical precautions.    Time  8    Period  Weeks    Status  New    Target Date  06/11/19      Additional Long Term Goals   Additional Long Term Goals  Yes       PT LONG TERM GOAL #6   Title  Complete community, work and/or recreational activities without limitation due to current condition.    Baseline  prolonged standing, showering, walking, cannot work, cannot drive, running, hiking, any activities requiring weight bearing of the R foot.(04/16/2019);    Time  8    Period  Weeks    Status  New    Target Date  06/11/19            Plan - 04/28/19 2009    Clinical Impression Statement  Pt tolerated treatment well and continues to make progress towards goals. She was able to progress to arch raises and first ray flexion this session. Pt was able to complete all exercises with minimal to no lasting increase in pain or discomfort. Pt required multimodal cuing for proper technique and to facilitate improved neuromuscular control, strength, range of motion, and functional ability resulting in improved performance and form. 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 Patient would benefit from continued physical therapy to address remaining impairments and functional limitations to work towards stated goals and return to PLOF or maximal functional independence.    Personal Factors and Comorbidities  Fitness;Past/Current Experience;Time since onset of injury/illness/exacerbation    Examination-Activity Limitations  Squat;Stairs;Lift;Locomotion Level;Stand;Caring for Others;Carry;Transfers   prolonged standing, showering, walking, cannot work, cannot drive, running, hiking, any activities requiring weight bearing of the R foot.   Examination-Participation Restrictions  Cleaning;Shop;Yard Work;Community Activity;Driving;Interpersonal Relationship   work   Stability/Clinical Decision Making  Stable/Uncomplicated    Rehab Potential  Excellent    PT Frequency  2x / week    PT Duration  8 weeks    PT Treatment/Interventions  ADLs/Self Care Home  Management;Biofeedback;Cryotherapy;Electrical Stimulation;Moist Heat;Gait training;Stair training;Functional mobility training;Therapeutic  activities;Therapeutic exercise;Balance training;Neuromuscular re-education;Patient/family education;Manual techniques;Dry needling;Taping;Passive range of motion;Scar mobilization;Spinal Manipulations;Joint Manipulations    PT Next Visit Plan  progress ROM, strengtheing, proprioception/motor control, scar mobility as tolerated; hip strengthening    PT Home Exercise Plan  Medbridge Access Code: [email or web address]    Consulted and Agree with Plan of Care  Patient       Patient will benefit from skilled therapeutic intervention in order to improve the following deficits and impairments:  Abnormal gait, Decreased balance, Decreased endurance, Decreased mobility, Decreased skin integrity, Difficulty walking, Hypomobility, Increased muscle spasms, Decreased knowledge of precautions, Decreased range of motion, Decreased scar mobility, Increased edema, Impaired perceived functional ability, Improper body mechanics, Decreased activity tolerance, Decreased coordination, Decreased strength, Increased fascial restricitons, Impaired flexibility, Pain  Visit Diagnosis: Pain in right foot  Stiffness of right foot, not elsewhere classified  Stiffness of right ankle, not elsewhere classified  Muscle weakness (generalized)  Difficulty in walking, not elsewhere classified     Problem List There are no active problems to display for this patient.   Everlean Alstrom. Graylon Good, PT, DPT 04/28/19, 8:11 PM  [facility] [address], [place], [address] Phone: [phone]   Fax:  [phone]  Name: Brianna Odonnell MRN: [id number] Date of Birth: [birth year]

## 2019-04-30 ENCOUNTER — Other Ambulatory Visit: Payer: Self-pay | Admitting: Podiatry

## 2019-04-30 ENCOUNTER — Other Ambulatory Visit: Payer: Self-pay

## 2019-05-01 ENCOUNTER — Ambulatory Visit: Payer: Commercial Managed Care - PPO | Attending: Podiatry | Admitting: Physical Therapy

## 2019-05-01 ENCOUNTER — Encounter: Payer: Self-pay | Admitting: Physical Therapy

## 2019-05-01 ENCOUNTER — Other Ambulatory Visit: Payer: Self-pay

## 2019-05-01 DIAGNOSIS — M25671 Stiffness of right ankle, not elsewhere classified: Secondary | ICD-10-CM | POA: Diagnosis present

## 2019-05-01 DIAGNOSIS — M25674 Stiffness of right foot, not elsewhere classified: Secondary | ICD-10-CM | POA: Insufficient documentation

## 2019-05-01 DIAGNOSIS — M6281 Muscle weakness (generalized): Secondary | ICD-10-CM | POA: Diagnosis present

## 2019-05-01 DIAGNOSIS — R262 Difficulty in walking, not elsewhere classified: Secondary | ICD-10-CM | POA: Insufficient documentation

## 2019-05-01 DIAGNOSIS — M79671 Pain in right foot: Secondary | ICD-10-CM | POA: Insufficient documentation

## 2019-05-01 NOTE — Therapy (Signed)
[facility] [address], [place], [address] Phone: [phone]   Fax:  [phone]  Physical Therapy Treatment  Patient Details  Name: TOMMI CREPEAU MRN: [id number] Date of Birth: [birth year] Referring Provider (PT): Rosetta Posner, DPM   Encounter Date: 05/01/2019  PT End of Session - 05/01/19 1616    Visit Number  5    Number of Visits  16    Date for PT Re-Evaluation  06/11/19    Authorization Type  Reporting period from 04/16/2019    Authorization - Visit Number  5    Authorization - Number of Visits  10    PT Start Time  1605    PT Stop Time  1645    PT Time Calculation (min)  40 min    Activity Tolerance  Patient tolerated treatment well    Behavior During Therapy  [facility] for tasks assessed/performed       Past Medical History:  Diagnosis Date  . Anxiety    situational  . Complication of anesthesia   . PONV (postoperative nausea and vomiting)     Past Surgical History:  Procedure Laterality Date  . MULTIPLE TOOTH EXTRACTIONS  2009  . OPEN REDUCTION INTERNAL FIXATION (ORIF) FOOT LISFRANC FRACTURE Right 02/20/2019   Procedure: LISFRANC;EACH JOINT X 2 RIGHT;  Surgeon: Rosetta Posner, DPM;  Location: [facility];  Service: Podiatry;  Laterality: Right;  GENERAL, POP/SAPH PREOP BLOCK UPREG  . TONSILLECTOMY    . UTERINE SEPTUM RESECTION  2019  . WISDOM TOOTH EXTRACTION      There were no vitals filed for this visit.  Subjective Assessment - 05/01/19 1610    Subjective  Patient reports she is feeling well today. Continues to have some soreness in her ankle. Saw her referring physician and he was unconcerned with the soreness and said she would get the screws out on tuesday 05/06/2019. After that she will be FWB and be able to walk in a surgical shoe. States there is a bit of clicking when the doctor moved the first ray and that it had shifted a bit but he was not concerned about it. No  excessive soreness following last treatment session.    Pertinent History  Patient is a 28 y.o. female who presents to outpatient physical therapy with a referral for medical diagnosis closed displced fracture of second metatarsal bone of R foot; post op R lisfranc ORIF. This patient's chief complaints consist of decreased function, pain, and stiffness of R foot leading to the following functional deficits: prolonged standing, showering, walking, cannot work, cannot drive, running, hiking, any activities requiring weight bearing of the R foot. Relevant past medical history and comorbidities include DENIES: brain, lung, diabetes, heart problems. .    Limitations  Standing;Walking;House hold activities;Other (comment)   Functional limitations: prolonged standing, showering, walking, cannot work, cannot drive, running, hiking, any activities requiring weight bearing of the R foot.   How long can you sit comfortably?  no limitations    How long can you stand comfortably?  25 min    How long can you walk comfortably?  takes longer and cannot take dogs on a walk.    Diagnostic tests  screw placement  excellent across medial cuneiform to second metatarsal.    Patient Stated Goals  She would like to go back to work and would like to be able to drive. Her doctor thinks it will be after the first of the year before  she can go back to work.    Currently in Pain?  No/denies    Pain Onset  More than a month ago       TREATMENT: S/P ORIF for R lisfranc fracture 02/20/2019. From Physician:"still ambulate in boot at all times until hardware is removed. Work on range of motonn, scar massage/loosening, strengthening. Once hardware is removed, then work outside of the boot on strenght/ROM/gait training/proprioception."  Therapeutic exercise:to centralize symptoms and improve ROM, strength, muscular endurance, and activity tolerance required for successful completion of functional activities.  Seated  exercises: -[employer], level4 with peg at lateral hole. plantarflexion/dorsiflexion, inversion/eversion, circles clockwise, circles counter clockwise, 2 min each plus time for instruction and transitions. Pt required cuing to keep knee stillbut good carry over from prior session.Recommendweight pegnext session.  - arch raises R foot to improve intrinsic foot activation and coordination in preparation for increased weight bearing activities out of the boot. 2x 2 min with 3 second holds.   - great toe flexion against green theraband while foot is on floor 2x15. To prepare for push off.      - R ankle 4-way isotonic strengthening against single red band, x 20 each direction. Mild discomfort felt in eversion and dorsiflexion during motion that resolved immediately with rest. Educated on acceptable discomfort level and appropriate response should it be more painful.  Standing exercises: - single leg stance with ball toss.3x10 each side. With second foot touch down support as needed. 3kg ball. SBA for safety. Cuing for technique.   Cuing for improved form, modifications for improved comfort, education on purpose of exercises and POC.   Manual therapy: to reduce pain and tissue tension, improve range of motion, neuromodulation, in order to promote improved ability to complete functional activities. - cross friction scar massage over incisions to decrease adhesions .Palpation over medial incision  HOME EXERCISE PROGRAM Access Code: [email or web address]  URL: [email or web address]  Date: 05/01/2019  Prepared by: Rosita Kea   Exercises Seated Great Toe Extension - 2 sets - 20 reps - 3 seconds hold - 2 2 Minutes - 2x daily - 7x weekly Seated Lesser Toes Extension - 2 sets - 20 reps - 3 seconds hold - 2 2 minutes - 2x daily - 7x weekly Toe Spreading - 2 sets - 20 reps - 3 seconds hold - 2x daily - 7x weekly Arch Lifting - 2 sets - 15 reps - 3 seconds hold - 1-2x  daily Seated Ankle Dorsiflexion with Resistance - 1 sets - 20 reps Seated Ankle Eversion with Resistance - 1 sets - 20 reps Seated Ankle Plantar Flexion with Resistance Loop - 1 sets - 20 reps Seated Figure 4 Ankle Inversion with Resistance - 1 sets - 20 reps    Patient Education Scar Massage   PT Education - 05/01/19 1616    Education Details  Exercise purpose/form. Self management techniques    Person(s) Educated  Patient    Methods  Explanation;Demonstration;Tactile cues;Verbal cues    Comprehension  Verbalized understanding;Returned demonstration;Verbal cues required;Tactile cues required       PT Short Term Goals - 04/22/19 1458      PT SHORT TERM GOAL #1   Title  Be independent with initial home exercise program for self-management of symptoms.    Baseline  Initial HEP provided at IE (04/16/2019);    Time  2    Period  Weeks    Status  Achieved    Target Date  04/30/19  PT Long Term Goals - 04/16/19 1832      PT LONG TERM GOAL #1   Title  Be independent with a long-term home exercise program for self-management of symptoms    Baseline  Initial HEP provided at IE (04/16/2019);    Time  8    Period  Weeks    Status  New    Target Date  06/11/19      PT LONG TERM GOAL #2   Title  Patient will be able to return to driving and work (09/81/1914)    Baseline  Currently unable to drive or work (78/29/5621);    Time  0    Period  Weeks    Status  New    Target Date  06/11/19      PT LONG TERM GOAL #3   Title  Demonstrate improved FOTO score by 10 units to demonstrate improvement in overall condition and self-reported functional ability.    Baseline  FOTO = 56 (04/16/2019);    Time  0    Period  Weeks    Status  New    Target Date  06/11/19      PT LONG TERM GOAL #4   Title  Patient will demonstrate R ankle and great toe A/PROM equal or greater than left ankle and great toe to improve ability to ambulate with normal gait pattern and return to PLOF running,  stairs, walking.    Baseline  see objective data (04/16/2019);    Time  8    Period  Weeks    Status  New    Target Date  06/11/19      PT LONG TERM GOAL #5   Title  Patient will demonstrate equal or greater than 4+/5 strength in all directions of R ankle to improve ability to complete weight bearing activities safely.    Baseline  not tested on R due to surgical precautions.    Time  8    Period  Weeks    Status  New    Target Date  06/11/19      Additional Long Term Goals   Additional Long Term Goals  Yes      PT LONG TERM GOAL #6   Title  Complete community, work and/or recreational activities without limitation due to current condition.    Baseline  prolonged standing, showering, walking, cannot work, cannot drive, running, hiking, any activities requiring weight bearing of the R foot.(04/16/2019);    Time  8    Period  Weeks    Status  New    Target Date  06/11/19            Plan - 05/01/19 1647    Clinical Impression Statement  Patient tolerated treatment well and continues to make progress towards goals. Was able to advance to heavier resistance band and HEP ankle exercises were reviewed today with modifications applied to improve effectiveness. Slight discomfort that was temporary in eversion. Improved comfort with decreased pronation. Pt required multimodal cuing for proper technique and to facilitate improved neuromuscular control, strength, range of motion, and functional ability resulting in improved performance and form. 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 Patient would benefit from continued physical therapy to address remaining impairments and functional limitations to work towards stated goals and return to PLOF or maximal functional independence.    Personal Factors and Comorbidities  Fitness;Past/Current Experience;Time since onset of injury/illness/exacerbation  Examination-Activity Limitations  Squat;Stairs;Lift;Locomotion Level;Stand;Caring for Others;Carry;Transfers   prolonged standing, showering, walking, cannot work, cannot drive, running, hiking, any activities requiring weight bearing of the R foot.   Examination-Participation Restrictions  Cleaning;Shop;Yard Work;Community Activity;Driving;Interpersonal Relationship   work   Stability/Clinical Decision Making  Stable/Uncomplicated    Rehab Potential  Excellent    PT Frequency  2x / week    PT Duration  8 weeks    PT Treatment/Interventions  ADLs/Self Care Home Management;Biofeedback;Cryotherapy;Electrical Stimulation;Moist Heat;Gait training;Stair training;Functional mobility training;Therapeutic activities;Therapeutic exercise;Balance training;Neuromuscular re-education;Patient/family education;Manual techniques;Dry needling;Taping;Passive range of motion;Scar mobilization;Spinal Manipulations;Joint Manipulations    PT Next Visit Plan  progress ROM, strengtheing, proprioception/motor control, scar mobility as tolerated; hip strengthening    PT Home Exercise Plan  Medbridge Access Code: [email or web address]    Consulted and Agree with Plan of Care  Patient       Patient will benefit from skilled therapeutic intervention in order to improve the following deficits and impairments:  Abnormal gait, Decreased balance, Decreased endurance, Decreased mobility, Decreased skin integrity, Difficulty walking, Hypomobility, Increased muscle spasms, Decreased knowledge of precautions, Decreased range of motion, Decreased scar mobility, Increased edema, Impaired perceived functional ability, Improper body mechanics, Decreased activity tolerance, Decreased coordination, Decreased strength, Increased fascial restricitons, Impaired flexibility, Pain  Visit Diagnosis: Pain in right foot  Stiffness of right foot, not elsewhere classified  Stiffness of right ankle, not elsewhere classified  Muscle weakness  (generalized)  Difficulty in walking, not elsewhere classified     Problem List There are no active problems to display for this patient.  Luretha MurphySara R. Ilsa IhaSnyder, PT, DPT 05/01/19, 4:59 PM  [facility] [address] [place], [place], [address] Phone: [phone]   Fax:  [phone]  Name: Marica OtterColleen M Greek MRN: [id number] Date of Birth: 06/12/1990

## 2019-05-02 ENCOUNTER — Other Ambulatory Visit
Admission: RE | Admit: 2019-05-02 | Discharge: 2019-05-02 | Disposition: A | Discharge status: Home / Self Care | Payer: Commercial Managed Care - PPO | Source: Ambulatory Visit | Attending: Podiatry | Admitting: Podiatry

## 2019-05-02 DIAGNOSIS — Z20828 Contact with and (suspected) exposure to other viral communicable diseases: Secondary | ICD-10-CM | POA: Insufficient documentation

## 2019-05-02 DIAGNOSIS — Z01812 Encounter for preprocedural laboratory examination: Secondary | ICD-10-CM | POA: Diagnosis present

## 2019-05-02 LAB — SARS CORONAVIRUS 2 (TAT 6-24 HRS): SARS Coronavirus 2: NEGATIVE

## 2019-05-05 ENCOUNTER — Encounter: Payer: Self-pay | Admitting: Physical Therapy

## 2019-05-05 ENCOUNTER — Other Ambulatory Visit: Payer: Self-pay

## 2019-05-05 ENCOUNTER — Ambulatory Visit: Payer: Commercial Managed Care - PPO | Admitting: Physical Therapy

## 2019-05-05 DIAGNOSIS — M79671 Pain in right foot: Secondary | ICD-10-CM

## 2019-05-05 DIAGNOSIS — M6281 Muscle weakness (generalized): Secondary | ICD-10-CM

## 2019-05-05 DIAGNOSIS — M25674 Stiffness of right foot, not elsewhere classified: Secondary | ICD-10-CM

## 2019-05-05 DIAGNOSIS — R262 Difficulty in walking, not elsewhere classified: Secondary | ICD-10-CM

## 2019-05-05 DIAGNOSIS — M25671 Stiffness of right ankle, not elsewhere classified: Secondary | ICD-10-CM

## 2019-05-05 NOTE — Therapy (Signed)
[facility] PHYSICAL AND SPORTS MEDICINE [address] [place], [address] Phone: [phone]   Fax:  [phone]  Physical Therapy Treatment  Patient Details  Name: Brianna Odonnell MRN: [id number] Date of Birth: 04/23/1991 Referring Provider (PT): Rosetta Posner, DPM   Encounter Date: 05/05/2019  PT End of Session - 05/05/19 1454    Visit Number  6    Number of Visits  16    Date for PT Re-Evaluation  06/11/19    Authorization Type  Reporting period from 04/16/2019    Authorization - Visit Number  6    Authorization - Number of Visits  10    PT Start Time  1448    PT Stop Time  1527    PT Time Calculation (min)  39 min    Activity Tolerance  Patient tolerated treatment well    Behavior During Therapy  [facility] for tasks assessed/performed       Past Medical History:  Diagnosis Date  . Anxiety    situational  . Complication of anesthesia   . PONV (postoperative nausea and vomiting)     Past Surgical History:  Procedure Laterality Date  . MULTIPLE TOOTH EXTRACTIONS  2009  . OPEN REDUCTION INTERNAL FIXATION (ORIF) FOOT LISFRANC FRACTURE Right 02/20/2019   Procedure: LISFRANC;EACH JOINT X 2 RIGHT;  Surgeon: Rosetta Posner, DPM;  Location: [facility];  Service: Podiatry;  Laterality: Right;  GENERAL, POP/SAPH PREOP BLOCK UPREG  . TONSILLECTOMY    . UTERINE SEPTUM RESECTION  2019  . WISDOM TOOTH EXTRACTION      There were no vitals filed for this visit.  Subjective Assessment - 05/05/19 1451    Subjective  Patient reports she is feeling good today with no pain upon arrival. She reports she has been using red theraband without difficulty. She noticed some increased swelling around the sock and the top incicion but no pain. It was gone by today. She had been doing a lot of shopping but was surprised because she has done more in the past. She has her screw removal.    Pertinent History  Patient is a 28 y.o. female who presents to  outpatient physical therapy with a referral for medical diagnosis closed displced fracture of second metatarsal bone of R foot; post op R lisfranc ORIF. This patient's chief complaints consist of decreased function, pain, and stiffness of R foot leading to the following functional deficits: prolonged standing, showering, walking, cannot work, cannot drive, running, hiking, any activities requiring weight bearing of the R foot. Relevant past medical history and comorbidities include DENIES: brain, lung, diabetes, heart problems. .    Limitations  Standing;Walking;House hold activities;Other (comment)   Functional limitations: prolonged standing, showering, walking, cannot work, cannot drive, running, hiking, any activities requiring weight bearing of the R foot.   How long can you sit comfortably?  no limitations    How long can you stand comfortably?  25 min    How long can you walk comfortably?  takes longer and cannot take dogs on a walk.    Diagnostic tests  screw placement  excellent across medial cuneiform to second metatarsal.    Patient Stated Goals  She would like to go back to work and would like to be able to drive. Her doctor thinks it will be after the first of the year before she can go back to work.    Currently in Pain?  No/denies    Pain Onset  More  than a month ago        TREATMENT: S/P ORIF for R lisfranc fracture 02/20/2019. From Physician:"still ambulate in boot at all times until hardware is removed. Work on range of motonn, scar massage/loosening, strengthening. Once hardware is removed, then work outside of the boot on strength/ROM/gait training/proprioception."  Therapeutic exercise:to centralize symptoms and improve ROM, strength, muscular endurance, and activity tolerance required for successful completion of functional activities.  Seated exercises: -[employer], level5with peg at lateral hole.plantarflexion/dorsiflexion, inversion/eversion,  circles clockwise, circles counter clockwise, 2 min each plus time for instruction and transitions. Pt required cuing to keep knee stillbut good carry over from prior session.  - R toe plays to improve intrinsic foot activation and strength, 3 second hold, x20.   - R Toe Yoga: great toe extension with small toes flexion pressure into floor, repeated 3 sec holds; small toes extension with great toe flexion pressure into floor, repeated 3 sec holds. Ball of foot and heel maintains contact with floor. To improve intrinsic foot muscle activation and strength in order to better support arch and intrinsic foot structures. X20. External cuing with yoga block medial to great toe.   - arch raises R foot to improve intrinsic foot activation and coordination in preparation for increased weight bearing activities out of the boot. x20 with 3 second holds.   - great toe flexion against green theraband while foot is on floor 20 with 3 seonc hold. To prepare for push off.    - R ankle 4-way isotonic strengthening against single green band, x 20 each direction. Mild discomfort felt in eversion and dorsiflexion during motion that resolved immediately with rest. Educated on acceptable discomfort level and appropriate response should it be more painful.   Standing exercises: - single leg stance with ball toss.3x10 each side. With second foot touch down support as needed. 3kg ball. SBA for safety. Cuing for technique.  Cuing for improved form, modifications for improved comfort, education on purpose of exercises and POC.    HOME EXERCISE PROGRAM Access Code: [email or web address]      URL: [email or web address]    Date: 05/01/2019  Prepared by: Rosita Kea   Exercises Seated Great Toe Extension - 2 sets - 20 reps - 3 seconds hold - 2 2 Minutes - 2x daily - 7x weekly Seated Lesser Toes Extension - 2 sets - 20 reps - 3 seconds hold - 2 2 minutes - 2x daily - 7x weekly Toe Spreading - 2 sets - 20  reps - 3 seconds hold - 2x daily - 7x weekly Arch Lifting - 2 sets - 15 reps - 3 seconds hold - 1-2x daily Seated Ankle Dorsiflexion with Resistance - 1 sets - 20 reps Seated Ankle Eversion with Resistance - 1 sets - 20 reps Seated Ankle Plantar Flexion with Resistance Loop - 1 sets - 20 reps Seated Figure 4 Ankle Inversion with Resistance - 1 sets - 20 reps                          Patient Education Scar Massage    PT Education - 05/05/19 1453    Education Details  Exercise purpose/form. Self management techniques    Person(s) Educated  Patient    Methods  Explanation;Demonstration;Tactile cues;Verbal cues    Comprehension  Verbalized understanding;Returned demonstration;Verbal cues required;Tactile cues required;Need further instruction       PT Short Term Goals - 04/22/19 1458      PT SHORT TERM  GOAL #1   Title  Be independent with initial home exercise program for self-management of symptoms.    Baseline  Initial HEP provided at IE (04/16/2019);    Time  2    Period  Weeks    Status  Achieved    Target Date  04/30/19        PT Long Term Goals - 04/16/19 1832      PT LONG TERM GOAL #1   Title  Be independent with a long-term home exercise program for self-management of symptoms    Baseline  Initial HEP provided at IE (04/16/2019);    Time  8    Period  Weeks    Status  New    Target Date  06/11/19      PT LONG TERM GOAL #2   Title  Patient will be able to return to driving and work (82/95/6213)    Baseline  Currently unable to drive or work (08/65/7846);    Time  0    Period  Weeks    Status  New    Target Date  06/11/19      PT LONG TERM GOAL #3   Title  Demonstrate improved FOTO score by 10 units to demonstrate improvement in overall condition and self-reported functional ability.    Baseline  FOTO = 56 (04/16/2019);    Time  0    Period  Weeks    Status  New    Target Date  06/11/19      PT LONG TERM GOAL #4   Title  Patient will demonstrate R ankle  and great toe A/PROM equal or greater than left ankle and great toe to improve ability to ambulate with normal gait pattern and return to PLOF running, stairs, walking.    Baseline  see objective data (04/16/2019);    Time  8    Period  Weeks    Status  New    Target Date  06/11/19      PT LONG TERM GOAL #5   Title  Patient will demonstrate equal or greater than 4+/5 strength in all directions of R ankle to improve ability to complete weight bearing activities safely.    Baseline  not tested on R due to surgical precautions.    Time  8    Period  Weeks    Status  New    Target Date  06/11/19      Additional Long Term Goals   Additional Long Term Goals  Yes      PT LONG TERM GOAL #6   Title  Complete community, work and/or recreational activities without limitation due to current condition.    Baseline  prolonged standing, showering, walking, cannot work, cannot drive, running, hiking, any activities requiring weight bearing of the R foot.(04/16/2019);    Time  8    Period  Weeks    Status  New    Target Date  06/11/19            Plan - 05/05/19 1509    Clinical Impression Statement  Patient tolerated treatment well and was able to advance to green theraband with ankle strengthening. She is showing improved intrinsic foot muscle control with practice but continues to require cuing. Is expected to get screws removed tomorrow and then be able to weight bear out of the boot next PT session. Pt required multimodal cuing for proper technique and to facilitate improved neuromuscular control, strength, range of motion, and functional ability  resulting in improved performance and form. 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 Patient would benefit from continued physical therapy to address remaining impairments and functional limitations to work towards stated goals and return to PLOF or maximal functional  independence.    Personal Factors and Comorbidities  Fitness;Past/Current Experience;Time since onset of injury/illness/exacerbation    Examination-Activity Limitations  Squat;Stairs;Lift;Locomotion Level;Stand;Caring for Others;Carry;Transfers   prolonged standing, showering, walking, cannot work, cannot drive, running, hiking, any activities requiring weight bearing of the R foot.   Examination-Participation Restrictions  Cleaning;Shop;Yard Work;Community Activity;Driving;Interpersonal Relationship   work   Stability/Clinical Decision Making  Stable/Uncomplicated    Rehab Potential  Excellent    PT Frequency  2x / week    PT Duration  8 weeks    PT Treatment/Interventions  ADLs/Self Care Home Management;Biofeedback;Cryotherapy;Electrical Stimulation;Moist Heat;Gait training;Stair training;Functional mobility training;Therapeutic activities;Therapeutic exercise;Balance training;Neuromuscular re-education;Patient/family education;Manual techniques;Dry needling;Taping;Passive range of motion;Scar mobilization;Spinal Manipulations;Joint Manipulations    PT Next Visit Plan  progress ROM, strengtheing, proprioception/motor control, scar mobility as tolerated; hip strengthening    PT Home Exercise Plan  Medbridge Access Code: [email or web address]    Consulted and Agree with Plan of Care  Patient       Patient will benefit from skilled therapeutic intervention in order to improve the following deficits and impairments:  Abnormal gait, Decreased balance, Decreased endurance, Decreased mobility, Decreased skin integrity, Difficulty walking, Hypomobility, Increased muscle spasms, Decreased knowledge of precautions, Decreased range of motion, Decreased scar mobility, Increased edema, Impaired perceived functional ability, Improper body mechanics, Decreased activity tolerance, Decreased coordination, Decreased strength, Increased fascial restricitons, Impaired flexibility, Pain  Visit Diagnosis: Pain in right  foot  Stiffness of right foot, not elsewhere classified  Stiffness of right ankle, not elsewhere classified  Muscle weakness (generalized)  Difficulty in walking, not elsewhere classified     Problem List There are no active problems to display for this patient.  Luretha MurphySara R. Ilsa IhaSnyder, PT, DPT 05/05/19, 3:23 PM  [facility] PHYSICAL AND SPORTS MEDICINE [address] [place], [place], [address] Phone: [phone]   Fax:  [phone]  Name: Brianna Odonnell MRN: [id number] Date of Birth: 03/11/1991

## 2019-05-06 ENCOUNTER — Ambulatory Visit: Payer: Commercial Managed Care - PPO | Admitting: Anesthesiology

## 2019-05-06 ENCOUNTER — Ambulatory Visit
Admission: RE | Admit: 2019-05-06 | Discharge: 2019-05-06 | Disposition: A | Discharge status: Home / Self Care | Payer: Commercial Managed Care - PPO | Attending: Podiatry | Admitting: Podiatry

## 2019-05-06 ENCOUNTER — Encounter
Admission: RE | Disposition: A | Discharge status: Home / Self Care | Payer: Self-pay | Source: Home / Self Care | Attending: Podiatry

## 2019-05-06 DIAGNOSIS — T8484XA Pain due to internal orthopedic prosthetic devices, implants and grafts, initial encounter: Secondary | ICD-10-CM | POA: Insufficient documentation

## 2019-05-06 DIAGNOSIS — Y831 Surgical operation with implant of artificial internal device as the cause of abnormal reaction of the patient, or of later complication, without mention of misadventure at the time of the procedure: Secondary | ICD-10-CM | POA: Insufficient documentation

## 2019-05-06 DIAGNOSIS — S93324S Dislocation of tarsometatarsal joint of right foot, sequela: Secondary | ICD-10-CM

## 2019-05-06 HISTORY — PX: MINOR HARDWARE REMOVAL: SHX6474

## 2019-05-06 LAB — POCT PREGNANCY, URINE: Preg Test, Ur: NEGATIVE

## 2019-05-06 SURGERY — MINOR HARDWARE REMOVAL
Anesthesia: General | Site: Foot | Laterality: Right

## 2019-05-06 MED ORDER — CEFAZOLIN SODIUM-DEXTROSE 2-4 GM/100ML-% IV SOLN
2.0000 g | INTRAVENOUS | Status: AC
  Administered 2019-05-06: 2 g via INTRAVENOUS

## 2019-05-06 MED ORDER — DEXAMETHASONE SODIUM PHOSPHATE 4 MG/ML IJ SOLN
INTRAMUSCULAR | Status: DC | PRN
  Administered 2019-05-06: 4 mg via INTRAVENOUS

## 2019-05-06 MED ORDER — FENTANYL CITRATE (PF) 100 MCG/2ML IJ SOLN
INTRAMUSCULAR | Status: DC | PRN
  Administered 2019-05-06: 100 ug via INTRAVENOUS

## 2019-05-06 MED ORDER — ACETAMINOPHEN 10 MG/ML IV SOLN: 1000.0000 mg | Freq: Once | INTRAVENOUS | Status: DC | PRN

## 2019-05-06 MED ORDER — LACTATED RINGERS IV SOLN
10.0000 mL/h | INTRAVENOUS | Status: DC
  Administered 2019-05-06: 12:00:00 via INTRAVENOUS

## 2019-05-06 MED ORDER — BUPIVACAINE HCL 0.5 % IJ SOLN
INTRAMUSCULAR | Status: DC | PRN
  Administered 2019-05-06: 20 mL

## 2019-05-06 MED ORDER — OXYCODONE HCL 5 MG/5ML PO SOLN: 5.0000 mg | Freq: Once | ORAL | Status: DC | PRN

## 2019-05-06 MED ORDER — PROPOFOL 10 MG/ML IV BOLUS
INTRAVENOUS | Status: DC | PRN
  Administered 2019-05-06: 150 mg via INTRAVENOUS

## 2019-05-06 MED ORDER — ONDANSETRON HCL 4 MG/2ML IJ SOLN
INTRAMUSCULAR | Status: DC | PRN
  Administered 2019-05-06: 4 mg via INTRAVENOUS

## 2019-05-06 MED ORDER — POVIDONE-IODINE 7.5 % EX SOLN: Freq: Once | CUTANEOUS | Status: DC

## 2019-05-06 MED ORDER — ONDANSETRON HCL 4 MG PO TABS: 4.0000 mg | ORAL_TABLET | Freq: Three times a day (TID) | ORAL | 1 refills | Status: AC | PRN

## 2019-05-06 MED ORDER — FENTANYL CITRATE (PF) 100 MCG/2ML IJ SOLN: 25.0000 ug | INTRAMUSCULAR | Status: DC | PRN

## 2019-05-06 MED ORDER — SCOPOLAMINE 1 MG/3DAYS TD PT72: 1.0000 | MEDICATED_PATCH | Freq: Once | TRANSDERMAL | Status: DC

## 2019-05-06 MED ORDER — LACTATED RINGERS IV SOLN: INTRAVENOUS | Status: DC

## 2019-05-06 MED ORDER — OXYCODONE HCL 5 MG PO TABS: 5.0000 mg | ORAL_TABLET | Freq: Once | ORAL | Status: DC | PRN

## 2019-05-06 MED ORDER — MIDAZOLAM HCL 5 MG/5ML IJ SOLN
INTRAMUSCULAR | Status: DC | PRN
  Administered 2019-05-06: 2 mg via INTRAVENOUS

## 2019-05-06 MED ORDER — HYDROCODONE-ACETAMINOPHEN 5-325 MG PO TABS: 1.0000 | ORAL_TABLET | Freq: Four times a day (QID) | ORAL | 0 refills | Status: AC | PRN

## 2019-05-06 MED ORDER — LIDOCAINE HCL (CARDIAC) PF 100 MG/5ML IV SOSY
PREFILLED_SYRINGE | INTRAVENOUS | Status: DC | PRN
  Administered 2019-05-06: 40 mg via INTRATRACHEAL

## 2019-05-06 MED ORDER — ONDANSETRON HCL 4 MG/2ML IJ SOLN
4.0000 mg | Freq: Once | INTRAMUSCULAR | Status: AC | PRN
  Administered 2019-05-06: 13:00:00 4 mg via INTRAVENOUS

## 2019-05-06 MED ORDER — GLYCOPYRROLATE 0.2 MG/ML IJ SOLN
INTRAMUSCULAR | Status: DC | PRN
  Administered 2019-05-06: 0.1 mg via INTRAVENOUS

## 2019-05-06 SURGICAL SUPPLY — 28 items
BNDG COHESIVE 4X5 TAN STRL (GAUZE/BANDAGES/DRESSINGS) ×3 IMPLANT
BNDG ESMARK 4X12 TAN STRL LF (GAUZE/BANDAGES/DRESSINGS) ×3 IMPLANT
BNDG GAUZE 4.5X4.1 6PLY STRL (MISCELLANEOUS) ×3 IMPLANT
BNDG STRETCH 4X75 STRL LF (GAUZE/BANDAGES/DRESSINGS) ×3 IMPLANT
CLOSURE WOUND 1/4X4 (GAUZE/BANDAGES/DRESSINGS) ×1
DRAPE FLUOR MINI C-ARM 54X84 (DRAPES) ×3 IMPLANT
DURAPREP 26ML APPLICATOR (WOUND CARE) ×3 IMPLANT
ELECT REM PT RETURN 9FT ADLT (ELECTROSURGICAL) ×3
ELECTRODE REM PT RTRN 9FT ADLT (ELECTROSURGICAL) ×1 IMPLANT
GAUZE SPONGE 4X4 12PLY STRL (GAUZE/BANDAGES/DRESSINGS) ×3 IMPLANT
GAUZE XEROFORM 1X8 LF (GAUZE/BANDAGES/DRESSINGS) ×3 IMPLANT
GLOVE BIO SURGEON STRL SZ7.5 (GLOVE) ×3 IMPLANT
GLOVE INDICATOR 8.0 STRL GRN (GLOVE) ×3 IMPLANT
GOWN STRL REUS W/ TWL LRG LVL3 (GOWN DISPOSABLE) ×2 IMPLANT
GOWN STRL REUS W/TWL LRG LVL3 (GOWN DISPOSABLE) ×4
NDL FILTER BLUNT 18X1 1/2 (NEEDLE) ×1 IMPLANT
NDL HYPO 25GX1 SAFETY (NEEDLE) ×3 IMPLANT
NEEDLE FILTER BLUNT 18X 1/2SAF (NEEDLE) ×2
NEEDLE FILTER BLUNT 18X1 1/2 (NEEDLE) ×1 IMPLANT
NEEDLE HYPO 25GX1 SAFETY (NEEDLE) ×9 IMPLANT
NS IRRIG 500ML POUR BTL (IV SOLUTION) ×3 IMPLANT
PACK EXTREMITY ARMC (MISCELLANEOUS) ×3 IMPLANT
PENCIL SMOKE EVACUATOR (MISCELLANEOUS) ×3 IMPLANT
STRIP CLOSURE SKIN 1/4X4 (GAUZE/BANDAGES/DRESSINGS) ×2 IMPLANT
SUT ETHILON 3-0 (SUTURE) ×2 IMPLANT
SUT VIC AB 3-0 SH 27 (SUTURE) ×2
SUT VIC AB 3-0 SH 27X BRD (SUTURE) IMPLANT
SYR 10ML LL (SYRINGE) ×3 IMPLANT

## 2019-05-06 NOTE — Op Note (Signed)
PODIATRY / FOOT AND ANKLE SURGERY OPERATIVE REPORT    SURGEON: [name], DPM  PRE-OPERATIVE DIAGNOSIS: Painful orthopedic hardware right foot  POST-OPERATIVE DIAGNOSIS: Same  PROCEDURE(S): 1. Removal of painful orthopedic hardware right foot, 2 screws from previous Lisfranc fracture dislocation reduction  HEMOSTASIS: Right ankle tourniquet  ANESTHESIA: MAC  ESTIMATED BLOOD LOSS: 5 cc  FINDING(S): 1.  Hardware intact  PATHOLOGY/SPECIMEN(S): None  INDICATIONS:   Brianna Odonnell is a 28 y.o. female who presents with painful orthopedic hardware in the right foot status post Lisfranc fracture dislocation with open reduction internal fixation.  Patient is now about 11 to 12 weeks out from the initial procedure and is now weightbearing comfortably in a cam boot and is performing physical therapy.  Patient states that she is doing very well overall but has some stiffness in her foot especially around where the screws were placed.  Instructed patient prior to the initial procedure that she would have to have these screws removed at about 3 months after the procedure.  Patient presents today for painful orthopedic hardware removal from initial Lisfranc ORIF procedure..  DESCRIPTION: After obtaining full informed written consent, the patient was brought back to the operating room and placed supine upon the operating table.  The patient received IV antibiotics prior to induction.  After obtaining adequate anesthesia a block was performed with 10 cc of half percent Marcaine plain about the medial cuneiform area in the area of screw placement that needed to be removed. The patient was prepped and draped in the standard fashion.  An Esmarch bandage was used to exsanguinate the patient's right lower extremity and the pneumatic ankle tourniquet was inflated.  Attention was then directed to the medial cuneiform area where under fluoroscopic guidance the 2 screws that were placed were marked.  A 2-3  cm linear longitudinal incision was made along the area of the screw placement.  The incision was deepened to subcutaneous tissue utilizing sharp and blunt dissection care was taken to identify and retract all vital neurovascular structures and all venous contributories were cauterized as necessary.  At this time an incision was made into the deep fascia/periosteum of the medial cuneiform and the periosteum was reflected dorsally and plantarly thereby exposing the medial cuneiform at the operative site and the screws were exposed.  A curette was used to free up the screw margins and a 0.045 K wire was then placed through one of the screws and the screw was backed out and then the same thing was performed to the other screw and both screws were backed out.  Final C-arm imaging was then taken showing still maintained reduction of the Lisfranc fracture dislocation overall.  Patient has notable increased range of motion at the first ray none at this point now that the hardware has been removed.  No clicking of the joints is noted.  Surgical site was flushed with copious amounts normal sterile saline.  The K wire was removed and passed off the operative site prior to that.  The capsular and periosteal structures as well as subcutaneous tissues were approximated well coapted with 3-0 Vicryl and the skin was then reapproximated well coapted with 3-0 nylon horizontal mattress type stitching.  An additional 10 cc of half percent Marcaine plain was injected about the operative site.  The pneumatic ankle tourniquet was deflated and a prompt hyperemic response was noted all digits of the right foot.  Postoperative dressing was then applied consisting of Xeroform followed by 4 x 4 gauze,  Kerlix, Ace wrap.  The patient tolerated the procedure and anesthesia well was transferred to recovery room vital signs stable vascular status intact all toes the right foot.  Following.  Postoperative monitoring the patient be discharged home  the following written oral postop instructions keep surgical dressings clean, dry, and intact, ice and elevate right lower extremity when at rest, maintain partial weightbearing in a surgical shoe or boot at all times until the incision is completely healed, take postoperative pain medication and antinausea medicine as prescribed, follow-up in 1 week for incision evaluation.  COMPLICATIONS: None  CONDITION: Good, stable  [name], DPM

## 2019-05-06 NOTE — Transfer of Care (Signed)
Immediate Anesthesia Transfer of Care Note  Patient: Brianna Odonnell  Procedure(s) Performed: REMOVAL SCREW; DEEP (Right Foot)  Patient Location: PACU  Anesthesia Type: General  Level of Consciousness: awake, alert  and patient cooperative  Airway and Oxygen Therapy: Patient Spontanous Breathing and Patient connected to supplemental oxygen  Post-op Assessment: Post-op Vital signs reviewed, Patient's Cardiovascular Status Stable, Respiratory Function Stable, Patent Airway and No signs of Nausea or vomiting  Post-op Vital Signs: Reviewed and stable  Complications: No apparent anesthesia complications

## 2019-05-06 NOTE — Anesthesia Preprocedure Evaluation (Signed)
Anesthesia Evaluation  Patient identified by MRN, date of birth, ID band Patient awake    Reviewed: Allergy & Precautions, NPO status , Patient's Chart, lab work & pertinent test results  History of Anesthesia Complications (+) PONV and history of anesthetic complications  Airway Mallampati: I   Neck ROM: Full    Dental no notable dental hx.    Pulmonary neg pulmonary ROS,    Pulmonary exam normal breath sounds clear to auscultation       Cardiovascular Exercise Tolerance: Good negative cardio ROS Normal cardiovascular exam Rhythm:Regular Rate:Normal     Neuro/Psych PSYCHIATRIC DISORDERS Anxiety negative neurological ROS     GI/Hepatic negative GI ROS,   Endo/Other  negative endocrine ROS  Renal/GU negative Renal ROS     Musculoskeletal   Abdominal   Peds  Hematology negative hematology ROS (+)   Anesthesia Other Findings   Reproductive/Obstetrics                             Anesthesia Physical Anesthesia Plan  ASA: II  Anesthesia Plan: General   Post-op Pain Management:    Induction: Intravenous  PONV Risk Score and Plan: 4 or greater and Ondansetron, Dexamethasone, Scopolamine patch - Pre-op and Treatment may vary due to age or medical condition  Airway Management Planned: LMA  Additional Equipment:   Intra-op Plan:   Post-operative Plan: Extubation in OR  Informed Consent: I have reviewed the patients History and Physical, chart, labs and discussed the procedure including the risks, benefits and alternatives for the proposed anesthesia with the patient or authorized representative who has indicated his/her understanding and acceptance.       Plan Discussed with: CRNA  Anesthesia Plan Comments:         Anesthesia Quick Evaluation

## 2019-05-06 NOTE — Discharge Instructions (Signed)
General Anesthesia, Adult, Care After °This sheet gives you information about how to care for yourself after your procedure. Your health care provider may also give you more specific instructions. If you have problems or questions, contact your health care provider. °What can I expect after the procedure? °After the procedure, the following side effects are common: °· Pain or discomfort at the IV site. °· Nausea. °· Vomiting. °· Sore throat. °· Trouble concentrating. °· Feeling cold or chills. °· Weak or tired. °· Sleepiness and fatigue. °· Soreness and body aches. These side effects can affect parts of the body that were not involved in surgery. °Follow these instructions at home: ° °For at least 24 hours after the procedure: °· Have a responsible adult stay with you. It is important to have someone help care for you until you are awake and alert. °· Rest as needed. °· Do not: °? Participate in activities in which you could fall or become injured. °? Drive. °? Use heavy machinery. °? Drink alcohol. °? Take sleeping pills or medicines that cause drowsiness. °? Make important decisions or sign legal documents. °? Take care of children on your own. °Eating and drinking °· Follow any instructions from your health care provider about eating or drinking restrictions. °· When you feel hungry, start by eating small amounts of foods that are soft and easy to digest (bland), such as toast. Gradually return to your regular diet. °· Drink enough fluid to keep your urine pale yellow. °· If you vomit, rehydrate by drinking water, juice, or clear broth. °General instructions °· If you have sleep apnea, surgery and certain medicines can increase your risk for breathing problems. Follow instructions from your health care provider about wearing your sleep device: °? Anytime you are sleeping, including during daytime naps. °? While taking prescription pain medicines, sleeping medicines, or medicines that make you drowsy. °· Return to  your normal activities as told by your health care provider. Ask your health care provider what activities are safe for you. °· Take over-the-counter and prescription medicines only as told by your health care provider. °· If you smoke, do not smoke without supervision. °· Keep all follow-up visits as told by your health care provider. This is important. °Contact a health care provider if: °· You have nausea or vomiting that does not get better with medicine. °· You cannot eat or drink without vomiting. °· You have pain that does not get better with medicine. °· You are unable to pass urine. °· You develop a skin rash. °· You have a fever. °· You have redness around your IV site that gets worse. °Get help right away if: °· You have difficulty breathing. °· You have chest pain. °· You have blood in your urine or stool, or you vomit blood. °Summary °· After the procedure, it is common to have a sore throat or nausea. It is also common to feel tired. °· Have a responsible adult stay with you for the first 24 hours after general anesthesia. It is important to have someone help care for you until you are awake and alert. °· When you feel hungry, start by eating small amounts of foods that are soft and easy to digest (bland), such as toast. Gradually return to your regular diet. °· Drink enough fluid to keep your urine pale yellow. °· Return to your normal activities as told by your health care provider. Ask your health care provider what activities are safe for you. °This information is not   intended to replace advice given to you by your health care provider. Make sure you discuss any questions you have with your health care provider. Document Released: 08/21/2000 Document Revised: 05/18/2017 Document Reviewed: 12/29/2016 Elsevier Patient Education  [employer].   Scopolamine skin patches What is this medicine? SCOPOLAMINE (skoe POL a meen) is used to prevent nausea and vomiting caused by motion sickness,  anesthesia and surgery. This medicine may be used for other purposes; ask your health care provider or pharmacist if you have questions. COMMON BRAND NAME(S): Transderm Scop What should I tell my health care provider before I take this medicine? They need to know if you have any of these conditions:  are scheduled to have a gastric secretion test  glaucoma  heart disease  kidney disease  liver disease  lung or breathing disease, like asthma  mental illness  prostate disease  seizures  stomach or intestine problems  trouble passing urine  an unusual or allergic reaction to scopolamine, atropine, other medicines, foods, dyes, or preservatives  pregnant or trying to get pregnant  breast-feeding How should I use this medicine? This medicine is for external use only. Follow the directions on the prescription label. Wear only 1 patch at a time. Choose an area behind the ear, that is clean, dry, hairless and free from any cuts or irritation. Wipe the area with a clean dry tissue. Peel off the plastic backing of the skin patch, trying not to touch the adhesive side with your hands. Do not cut the patches. Firmly apply to the area you have chosen, with the metallic side of the patch to the skin and the tan-colored side showing. Once firmly in place, wash your hands well with soap and water. Do not get this medicine into your eyes. After removing the patch, wash your hands and the area behind your ear thoroughly with soap and water. The patch will still contain some medicine after use. To avoid accidental contact or ingestion by children or pets, fold the used patch in half with the sticky side together and throw away in the trash out of the reach of children and pets. If you need to use a second patch after you remove the first, place it behind the other ear. A special MedGuide will be given to you by the pharmacist with each prescription and refill. Be sure to read this information  carefully each time. Talk to your pediatrician regarding the use of this medicine in children. Special care may be needed. Overdosage: If you think you have taken too much of this medicine contact a poison control center or emergency room at once. NOTE: This medicine is only for you. Do not share this medicine with others. What if I miss a dose? This does not apply. This medicine is not for regular use. What may interact with this medicine?  alcohol  antihistamines for allergy cough and cold  atropine  certain medicines for anxiety or sleep  certain medicines for bladder problems like oxybutynin, tolterodine  certain medicines for depression like amitriptyline, fluoxetine, sertraline  certain medicines for stomach problems like dicyclomine, hyoscyamine  certain medicines for Parkinson's disease like benztropine, trihexyphenidyl  certain medicines for seizures like phenobarbital, primidone  general anesthetics like halothane, isoflurane, methoxyflurane, propofol  ipratropium  local anesthetics like lidocaine, pramoxine, tetracaine  medicines that relax muscles for surgery  phenothiazines like chlorpromazine, mesoridazine, prochlorperazine, thioridazine  narcotic medicines for pain  other belladonna alkaloids This list may not describe all possible interactions. Give  your health care provider a list of all the medicines, herbs, non-prescription drugs, or dietary supplements you use. Also tell them if you smoke, drink alcohol, or use illegal drugs. Some items may interact with your medicine. What should I watch for while using this medicine? Limit contact with water while swimming and bathing because the patch may fall off. If the patch falls off, throw it away and put a new one behind the other ear. You may get drowsy or dizzy. Do not drive, use machinery, or do anything that needs mental alertness until you know how this medicine affects you. Do not stand or sit up quickly,  especially if you are an older patient. This reduces the risk of dizzy or fainting spells. Alcohol may interfere with the effect of this medicine. Avoid alcoholic drinks. Your mouth may get dry. Chewing sugarless gum or sucking hard candy, and drinking plenty of water may help. Contact your healthcare professional if the problem does not go away or is severe. This medicine may cause dry eyes and blurred vision. If you wear contact lenses, you may feel some discomfort. Lubricating drops may help. See your healthcare professional if the problem does not go away or is severe. If you are going to need surgery, an MRI, CT scan, or other procedure, tell your healthcare professional that you are using this medicine. You may need to remove the patch before the procedure. What side effects may I notice from receiving this medicine? Side effects that you should report to your doctor or health care professional as soon as possible:  allergic reactions like skin rash, itching or hives; swelling of the face, lips, or tongue  blurred vision  changes in vision  confusion  dizziness  eye pain  fast, irregular heartbeat  hallucinations, loss of contact with reality  nausea, vomiting  pain or trouble passing urine  restlessness  seizures  skin irritation  stomach pain Side effects that usually do not require medical attention (report to your doctor or health care professional if they continue or are bothersome):  drowsiness  dry mouth  headache  sore throat This list may not describe all possible side effects. Call your doctor for medical advice about side effects. You may report side effects to FDA at 1-800-FDA-1088. Where should I keep my medicine? Keep out of the reach of children. Store at room temperature between 20 and 25 degrees C (68 and 77 degrees F). Keep this medicine in the foil package until ready to use. Throw away any unused medicine after the expiration date. NOTE: This  sheet is a summary. It may not cover all possible information. If you have questions about this medicine, talk to your doctor, pharmacist, or health care provider.  2020 Elsevier/Gold Standard (2017-08-03 16:14:46)  Okay to begin partial weightbearing in a surgical shoe or boot.  Okay to use either one but if one is more comfortable than the other than you may use either.  Recommend still trying to minimally weight-bear at this time as you still have an incision on the side of her foot that needs to heal over the next 2 weeks.  Recommend rest, ice, compression, elevation and use of Tylenol or NSAIDs for relief.  Pain medications also been sent into the pharmacy as well as antinausea medication.  Please pick these up and take these as prescribed.  We will see you back in 1 week for reevaluation of your incision.  You may continue physical therapy at this time.

## 2019-05-06 NOTE — Anesthesia Postprocedure Evaluation (Signed)
Anesthesia Post Note  Patient: Brianna Odonnell  Procedure(s) Performed: REMOVAL SCREW; DEEP (Right Foot)     Patient location during evaluation: PACU Anesthesia Type: General Level of consciousness: awake and alert, oriented and patient cooperative Pain management: pain level controlled Vital Signs Assessment: post-procedure vital signs reviewed and stable Respiratory status: spontaneous breathing, nonlabored ventilation and respiratory function stable Cardiovascular status: blood pressure returned to baseline and stable Postop Assessment: adequate PO intake Anesthetic complications: no    Darrin Nipper

## 2019-05-06 NOTE — H&P (Signed)
HISTORY AND PHYSICAL INTERVAL NOTE:  05/06/2019  11:39 AM  Brianna Odonnell  has presented today for surgery, with the diagnosis of T84.84XA  PAINFUL ORTHOPAEDIC HARDWARE.  The various methods of treatment have been discussed with the patient.  No guarantees were given.  After consideration of risks, benefits and other options for treatment, the patient has consented to surgery.  I have reviewed the patients' chart and labs.    PROCEDURE: Right foot removal of painful orthopedic hardware  A history and physical examination was performed in my office.  The patient was reexamined.  There have been no changes to this history and physical examination.  Brianna Odonnell

## 2019-05-06 NOTE — Anesthesia Procedure Notes (Signed)
Procedure Name: LMA Insertion Date/Time: 05/06/2019 11:55 AM Performed by: Mayme Genta, CRNA Pre-anesthesia Checklist: Patient identified, Emergency Drugs available, Suction available, Timeout performed and Patient being monitored Patient Re-evaluated:Patient Re-evaluated prior to induction Oxygen Delivery Method: Circle system utilized Preoxygenation: Pre-oxygenation with 100% oxygen Induction Type: IV induction LMA: LMA inserted LMA Size: 4.0 Number of attempts: 1 Placement Confirmation: positive ETCO2 and breath sounds checked- equal and bilateral Tube secured with: Tape

## 2019-05-07 ENCOUNTER — Encounter: Payer: Self-pay | Admitting: Podiatry

## 2019-05-08 ENCOUNTER — Ambulatory Visit: Payer: Commercial Managed Care - PPO | Admitting: Physical Therapy

## 2019-05-12 ENCOUNTER — Encounter: Payer: Self-pay | Admitting: Physical Therapy

## 2019-05-12 ENCOUNTER — Ambulatory Visit: Payer: Commercial Managed Care - PPO | Admitting: Physical Therapy

## 2019-05-12 ENCOUNTER — Other Ambulatory Visit: Payer: Self-pay

## 2019-05-12 DIAGNOSIS — M6281 Muscle weakness (generalized): Secondary | ICD-10-CM

## 2019-05-12 DIAGNOSIS — M79671 Pain in right foot: Secondary | ICD-10-CM | POA: Diagnosis not present

## 2019-05-12 DIAGNOSIS — R262 Difficulty in walking, not elsewhere classified: Secondary | ICD-10-CM

## 2019-05-12 DIAGNOSIS — M25671 Stiffness of right ankle, not elsewhere classified: Secondary | ICD-10-CM

## 2019-05-12 DIAGNOSIS — M25674 Stiffness of right foot, not elsewhere classified: Secondary | ICD-10-CM

## 2019-05-12 NOTE — Therapy (Signed)
[facility] [address], [place], [address] Phone: [phone]   Fax:  [phone]  Physical Therapy Treatment / Re-Evaluation / Re-Certification Reporting period: 04/16/2019 - 05/12/2019  Re-Evaluation performed due to change in medical status after going under general anesthesia for surgical procedure (removal of hardware 05/06/2019)  Patient Details  Name: Brianna Odonnell MRN: [id number] Date of Birth: 15-Aug-1990 Referring Provider (PT): [name], DPM   Encounter Date: 05/12/2019  PT End of Session - 05/12/19 1625    Visit Number  7    Number of Visits  23    Date for PT Re-Evaluation  07/07/19    Authorization Type  Reporting period from 04/16/2019    Authorization - Visit Number  7    Authorization - Number of Visits  10    PT Start Time  1622    PT Stop Time  1705    PT Time Calculation (min)  43 min    Activity Tolerance  Patient tolerated treatment well    Behavior During Therapy  [facility] for tasks assessed/performed       Past Medical History:  Diagnosis Date  . Anxiety    situational  . Complication of anesthesia   . PONV (postoperative nausea and vomiting)     Past Surgical History:  Procedure Laterality Date  . MINOR HARDWARE REMOVAL Right 05/06/2019   Procedure: REMOVAL SCREW; DEEP;  Surgeon: [name], DPM;  Location: [place];  Service: Podiatry;  Laterality: Right;  UPREG, LATEX  . MULTIPLE TOOTH EXTRACTIONS  2009  . OPEN REDUCTION INTERNAL FIXATION (ORIF) FOOT LISFRANC FRACTURE Right 02/20/2019   Procedure: LISFRANC;EACH JOINT X 2 RIGHT;  Surgeon: [name], DPM;  Location: [place];  Service: Podiatry;  Laterality: Right;  GENERAL, POP/SAPH PREOP BLOCK UPREG  . TONSILLECTOMY    . UTERINE SEPTUM RESECTION  2019  . WISDOM TOOTH EXTRACTION      There were no vitals filed for this visit.  Subjective Assessment - 05/12/19 1622    Subjective   Patient reports her screw removal went well and physician said to be careful with incision. He did not tell her that she could or could not weight bear without the boot. She has been carefully weight bearing to the bathroom. She has been doing her toe HEP. The first couple of days she was pretty swollen, but she is mostly just bruised now. She has not done band exercises since surgery.    Pertinent History  Patient is a 28 y.o. female who presents to outpatient physical therapy with a referral for medical diagnosis closed displced fracture of second metatarsal bone of R foot; post op R lisfranc ORIF. This patient's chief complaints consist of decreased function, pain, and stiffness of R foot leading to the following functional deficits: prolonged standing, showering, walking, cannot work, cannot drive, running, hiking, any activities requiring weight bearing of the R foot. Relevant past medical history and comorbidities include DENIES: brain, lung, diabetes, heart problems. .    Limitations  Standing;Walking;House hold activities;Other (comment)   Functional limitations: prolonged standing, showering, walking, cannot work, cannot drive, running, hiking, any activities requiring weight bearing of the R foot.   How long can you sit comfortably?  no limitations    How long can you stand comfortably?  25 min    How long can you walk comfortably?  takes longer and cannot take dogs on a walk.    Diagnostic tests  screw placement  excellent across medial cuneiform to second metatarsal.    Patient Stated Goals  She would like to go back to work and would like to be able to drive. Her doctor thinks it will be after the first of the year before she can go back to work.    Currently in Pain?  No/denies    Pain Onset  More than a month ago       [facility] PT Assessment - 05/12/19 0001      Assessment   Medical Diagnosis  Closed displced fracture of second metatarsal bone of R foot; post op R lisfranc ORIF     Referring Provider (PT)  [name], DPM    Onset Date/Surgical Date  02/20/19   fractured 01/30/2019   Next MD Visit  04/30/2019    Prior Therapy  not for this problem prior to this episode of care      Precautions   Precautions  Other (comment)   no weight bearing out of the boot   Precaution Comments  From Physician: "still ambulate in boot at all times until hardware is removed. Work on range of motoin, scar massage/lostening, strengthening. 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 12 hour shifts 3x a week. She works in     Leisure  traveling, going to the [place], read a lot, hang out with freinds and boyfreind, play with dogs      Cognition   Overall Cognitive Status  Within Functional Limits for tasks assessed      Observation/Other Assessments   Observations  see note from 05/12/2019 for latest objective data    Focus on Therapeutic Outcomes (FOTO)   FOTO = 65      OBJECTIVE  OBSERVATION/INSPECTION  Tremor: none  Muscle bulk: generally WFL bilaterally  Skin: unable to see incision site because of bandage. Checked by MD today.     Gait: grossly WFL for household and short community ambulation while wearing shoe. Demonstrates decreased R tibial translation, decreased heel lift and foot push off resulting in antalgic halting gait favoring R LE.   PERIPHERAL JOINT MOTION (in degrees) *Indicates pain, R/L Date    Joint/Motion  AROM PROM Comments  Ankle/Foot     Dorsiflexion (knee ext) 0/0 10/10   Dorsiflexion (knee flex) 15/10 15/10   Plantarflexion 55/76 /   [place] 20/35 /   Inversion 40/50 /   Great toe ext / 100/110 metatarsals to phalanges   Comments: mild pulling on right side with all motions. B hips and knees grossly WFL.   MUSCLE PERFORMANCE (MMT):  B hips, knees, equal and WFL L ankle 5/5 all directions R ankle   PF = 3+/5*  DF = 4*/5  Inversion = 4*/5  Eversion = 4+/5  Pronation = 3+*/5 (limited pressure)  ACCESSORY MOTION:   Deferred due  to recent surgery   FUNCTIONAL MOBILITY:  Bed mobility: WFL  Transfers: WFL while wearing cam boot  EDUCATION/COGNITION: Patient is alert and oriented X 4.   Objective measurements completed on examination: See above findings.   TREATMENT: S/P ORIF for R lisfranc fracture 02/20/2019; hardware removal 05/06/2019. From Physician:"still ambulate in boot at all times until hardware is removed. Work on range of motonn, scar massage/loosening, strengthening. Once hardware is removed, then work outside of the boot on strength/ROM/gait training/proprioception."  Therapeutic exercise:to centralize symptoms and improve ROM, strength, muscular endurance, and activity tolerance required for successful completion of functional activities.  Seated exercises: -[employer], level5with peg at lateral hole.plantarflexion/dorsiflexion, inversion/eversion, circles clockwise, circles counter clockwise, 2 min each plus time for instruction and transitions. Pt required cuing to keep knee stillbut good carry over from prior session.  - great toe flexion againstgreentheraband while foot is on floor 20 with 3 seonc hold. To prepare for push off.   - R ankle 4-way isotonic strengthening against singlegreenband, x 20 each direction. Mild discomfort felt in eversion and dorsiflexion during motion that resolved  immediately with rest. Educated on acceptable discomfort level and appropriate response should it be more painful.   Standing exercises: - single leg stance with ball toss.3x10 each side. With second foot touch down support as needed.3kg ball. SBA for safety. Wearing boot .  Cuing for improved form, modifications for improved comfort, education on purpose of exercises and POC.   HOME EXERCISE PROGRAM Access Code: [email or web address] URL: [email or web address] Date: 05/01/2019  Prepared by: Rosita Kea   Exercises Seated Great Toe Extension - 2 sets - 20 reps - 3 seconds hold - 2 2 Minutes - 2x daily - 7x weekly Seated Lesser Toes Extension - 2 sets - 20 reps - 3 seconds hold - 2 2 minutes - 2x daily - 7x weekly Toe Spreading - 2 sets - 20 reps - 3 seconds hold - 2x daily - 7x weekly Arch Lifting - 2 sets - 15 reps - 3 seconds hold - 1-2x daily Seated Ankle Dorsiflexion with Resistance - 1 sets - 20 reps Seated Ankle Eversion with Resistance - 1 sets - 20 reps Seated Ankle Plantar Flexion with Resistance Loop - 1 sets - 20 reps Seated Figure 4 Ankle Inversion with Resistance - 1 sets - 20 reps  Patient Education Scar Massage     PT Education - 05/12/19 1721    Education Details  Exercise purpose/form. Self management techniques, POC    Person(s) Educated  Patient    Methods  Explanation;Demonstration;Tactile cues;Verbal cues    Comprehension  Verbalized understanding;Returned demonstration;Verbal cues required;Need further instruction;Tactile cues required       PT Short Term Goals - 04/22/19 1458      PT SHORT TERM GOAL #1   Title  Be independent with initial home exercise program for self-management of symptoms.    Baseline  Initial HEP provided at IE (04/16/2019);    Time  2    Period  Weeks    Status  Achieved    Target Date  04/30/19        PT Long Term Goals - 05/12/19 1651      PT LONG TERM GOAL #1   Title  Be  independent with a long-term home exercise program for self-management of symptoms    Baseline  Initial HEP provided at IE (04/16/2019); currently participating in appropriate HEP    Time  8    Period  Weeks  Status  Partially Met    Target Date  07/07/19      PT LONG TERM GOAL #2   Title  Patient will be able to return to driving and work (70/35/0093)    Baseline  Currently unable to drive or work (81/82/9937); has started driving short distances; has not yet returned to work but is in discussions with supervisor (05/12/2019);    Time  0    Period  Weeks    Status  Achieved    Target Date  07/07/19      PT LONG TERM GOAL #3   Title  Demonstrate improved FOTO score by 10 units to demonstrate improvement in overall condition and self-reported functional ability.    Baseline  FOTO = 56 (04/16/2019); 65 (05/12/2019);    Time  0    Period  Weeks    Status  Partially Met    Target Date  07/07/19      PT LONG TERM GOAL #4   Title  Patient will demonstrate R ankle and great toe A/PROM equal or greater than left ankle and great toe to improve ability to ambulate with normal gait pattern and return to PLOF running, stairs, walking.    Baseline  see objective data (04/16/2019); see objective data (05/12/2019);    Time  8    Period  Weeks    Status  Partially Met    Target Date  07/07/19      PT LONG TERM GOAL #5   Title  Patient will demonstrate equal or greater than 4+/5 strength in all directions of R ankle to improve ability to complete weight bearing activities safely.    Baseline  not tested on R due to surgical precautions (04/16/2019); see objective exam (05/12/2019);    Time  8    Period  Weeks    Status  New    Target Date  07/07/19      PT LONG TERM GOAL #6   Title  Complete community, work and/or recreational activities without limitation due to current condition.    Baseline  prolonged standing, showering, walking, cannot work, cannot drive, running, hiking, any  activities requiring weight bearing of the R foot.(04/16/2019); improving with walking boot on but still limited (05/12/2019);    Time  8    Period  Weeks    Status  Partially Met    Target Date  07/07/19            Plan - 05/12/19 1649    Clinical Impression Statement  Patient has attended 7 physical therapy treatment session and is making progress towards goals. She is returning today following surgical removal of screws and now has a fresh incision. She has progressed to weight bearing in orthopedic shoe instead of cam boot. Per pt report surgery went well and the clicking she had previously is gone. Patient demonstrates progress towards goals but continues to require physical therapy due to ongoing weakness, post-op precautions, decreased standing tolerance, coordination, pain, and activity tolerance. Patient is a 28 y.o. female referred to outpatient physical therapy with a medical diagnosis of closed displced fracture of second metatarsal bone of R foot; post op R lisfranc ORIF performed 02/20/2019 and hardware removal 05/06/2019 who presents with signs and symptoms consistent with R foot pain, foot and ankle stiffness, altered gait pattern, and generalized weakness following R lisfranc fracture/dislocation and subsequent ORIF 02/20/2019 with hardware removal performed 05/06/2019. Patient presents with significant stiffness, ROM, strength, coordination, stability, proprioceptive, activity tolerance, balance,  gait, and muscle performance impairments that are limiting ability to complete all activities requiring R LE weight bearing including prolonged standing, showering, walking, working, driving, running, hiking without difficulty. Patient will benefit from skilled physical therapy intervention to address current body structure impairments and activity limitations to improve function and work towards goals set in current POC in order to return to prior level of function or maximal functional  improvement    Personal Factors and Comorbidities  Fitness;Past/Current Experience;Time since onset of injury/illness/exacerbation    Examination-Activity Limitations  Squat;Stairs;Lift;Locomotion Level;Stand;Caring for Others;Carry;Transfers   prolonged standing, showering, walking, cannot work, cannot drive, running, hiking, any activities requiring weight bearing of the R foot.   Examination-Participation Restrictions  Cleaning;Shop;Yard Work;Community Activity;Driving;Interpersonal Relationship   work   Stability/Clinical Decision Making  Stable/Uncomplicated    Rehab Potential  Excellent    PT Frequency  2x / week    PT Duration  8 weeks    PT Treatment/Interventions  ADLs/Self Care Home Management;Biofeedback;Cryotherapy;Electrical Stimulation;Moist Heat;Gait training;Stair training;Functional mobility training;Therapeutic activities;Therapeutic exercise;Balance training;Neuromuscular re-education;Patient/family education;Manual techniques;Dry needling;Taping;Passive range of motion;Scar mobilization;Spinal Manipulations;Joint Manipulations    PT Next Visit Plan  progress ROM, strengtheing, proprioception/motor control, scar mobility as tolerated; hip strengthening    PT Home Exercise Plan  Medbridge Access Code: [email or web address]    Consulted and Agree with Plan of Care  Patient       Patient will benefit from skilled therapeutic intervention in order to improve the following deficits and impairments:  Abnormal gait, Decreased balance, Decreased endurance, Decreased mobility, Decreased skin integrity, Difficulty walking, Hypomobility, Increased muscle spasms, Decreased knowledge of precautions, Decreased range of motion, Decreased scar mobility, Increased edema, Impaired perceived functional ability, Improper body mechanics, Decreased activity tolerance, Decreased coordination, Decreased strength, Increased fascial restricitons, Impaired flexibility, Pain  Visit Diagnosis: Pain in right  foot  Stiffness of right foot, not elsewhere classified  Stiffness of right ankle, not elsewhere classified  Muscle weakness (generalized)  Difficulty in walking, not elsewhere classified     Problem List There are no problems to display for this patient.   Everlean Alstrom. Graylon Good, PT, DPT 05/12/19, 5:22 PM  [facility] [address], [place], [address] Phone: [phone]   Fax:  [phone]  Name: Brianna GONSOULIN MRN: [id number] Date of Birth: [birth year]

## 2019-05-14 ENCOUNTER — Other Ambulatory Visit: Payer: Self-pay

## 2019-05-14 ENCOUNTER — Encounter: Payer: Self-pay | Admitting: Physical Therapy

## 2019-05-14 ENCOUNTER — Ambulatory Visit: Payer: Commercial Managed Care - PPO | Admitting: Physical Therapy

## 2019-05-14 DIAGNOSIS — R262 Difficulty in walking, not elsewhere classified: Secondary | ICD-10-CM

## 2019-05-14 DIAGNOSIS — M6281 Muscle weakness (generalized): Secondary | ICD-10-CM

## 2019-05-14 DIAGNOSIS — M25671 Stiffness of right ankle, not elsewhere classified: Secondary | ICD-10-CM

## 2019-05-14 DIAGNOSIS — M25674 Stiffness of right foot, not elsewhere classified: Secondary | ICD-10-CM

## 2019-05-14 DIAGNOSIS — M79671 Pain in right foot: Secondary | ICD-10-CM | POA: Diagnosis not present

## 2019-05-14 NOTE — Therapy (Signed)
[facility] [address] [place], [address] Phone: [phone]   Fax:  [phone]  Physical Therapy Treatment  Patient Details  Name: Brianna Odonnell MRN: [id number] Date of Birth: 1991-03-10 Referring Provider (PT): [name], DPM   Encounter Date: 05/14/2019  PT End of Session - 05/14/19 1306    Visit Number  8    Number of Visits  23    Date for PT Re-Evaluation  07/07/19    Authorization Type  Reporting period from 04/16/2019    Authorization - Visit Number  8    Authorization - Number of Visits  10    PT Start Time  1120    PT Stop Time  1200    PT Time Calculation (min)  40 min    Activity Tolerance  Patient tolerated treatment well    Behavior During Therapy  [facility] for tasks assessed/performed       Past Medical History:  Diagnosis Date  . Anxiety    situational  . Complication of anesthesia   . PONV (postoperative nausea and vomiting)     Past Surgical History:  Procedure Laterality Date  . MINOR HARDWARE REMOVAL Right 05/06/2019   Procedure: REMOVAL SCREW; DEEP;  Surgeon: [name], DPM;  Location: [facility];  Service: Podiatry;  Laterality: Right;  UPREG, LATEX  . MULTIPLE TOOTH EXTRACTIONS  2009  . OPEN REDUCTION INTERNAL FIXATION (ORIF) FOOT LISFRANC FRACTURE Right 02/20/2019   Procedure: LISFRANC;EACH JOINT X 2 RIGHT;  Surgeon: [name], DPM;  Location: [facility];  Service: Podiatry;  Laterality: Right;  GENERAL, POP/SAPH PREOP BLOCK UPREG  . TONSILLECTOMY    . UTERINE SEPTUM RESECTION  2019  . WISDOM TOOTH EXTRACTION      There were no vitals filed for this visit.  Subjective Assessment - 05/14/19 1305    Subjective  Patient reports she is feeling good today with no pain upon arrival. States she felt fine following last treatment session. She notices when she has pain it is in her toes. Is looking forwards to getting stitches out next monday.    Pertinent History  Patient is a 28 y.o. female who presents to outpatient physical therapy with a referral for medical diagnosis closed displced fracture of second metatarsal bone of R foot; post op R lisfranc ORIF. This patient's chief complaints consist of decreased function, pain, and stiffness of R foot leading to the following functional deficits: prolonged standing, showering, walking, cannot work, cannot drive, running, hiking, any activities requiring weight bearing of the R foot. Relevant past medical history and comorbidities include DENIES: brain, lung, diabetes, heart problems. .    Limitations  Standing;Walking;House hold activities;Other (comment)   Functional limitations: prolonged standing, showering, walking, cannot work, cannot drive, running, hiking, any activities requiring weight bearing of the R foot.   How long can you sit comfortably?  no limitations    How long can you stand comfortably?  25 min    How long can you walk comfortably?  takes longer and cannot take dogs on a walk.    Diagnostic tests  screw placement  excellent across medial cuneiform to second metatarsal.    Patient Stated Goals  She would like to go back to work and would like to be able to drive. Her doctor thinks it will be after the first of the year before she can go back to work.    Currently in Pain?  No/denies  Pain Onset  More than a month ago        TREATMENT: S/P ORIF for R lisfranc fracture 02/20/2019; hardware removal 05/06/2019. From Physician:"still ambulate in boot at all times until hardware is removed. Work on range of motonn, scar massage/loosening, strengthening. Once hardware is removed, then work outside of the boot on strength/ROM/gait training/proprioception."  Therapeutic exercise:to centralize symptoms and improve ROM, strength, muscular endurance, and activity tolerance required for successful completion of functional activities.  Seated exercises: - NuStep level 6 using  bilateral lower extremities. . For improved extremity mobility, muscular endurance, and activity tolerance; and to induce the analgesic effect of aerobic exercise, stimulate improved joint nutrition, and prepare body structures and systems for following interventions. x 5 minutes during subjective exam  - R toe plays to improve intrinsic foot activation and strength, 2 second hold, x20.   - R great toe abduction (away from midline of foot); x 20.   - R great toe flexion againstgreentheraband while foot is on floor20 with 3 second hold. To prepare for push off.    Standing exercise: - R Toe Yoga: great toe extension with small toes flexion pressure into floor, repeated 3 sec holds; small toes extension with great toe flexion pressure into floor, repeated 3 sec holds. Ball of foot and heel maintains contact with floor. To improve intrinsic foot muscle activation and strength in order to better support arch and intrinsic foot structures. X20. External cuing with yoga block medial to great toe.   - pre-gait practice loading R LE while stepping forward and back with L LE with unilateral UE support focusing on normal translation of tibia over foot and normal toe extension. Shoe removed doffed.   - ambulation back and forth in front of mirror with shoe removed, practicing improved gait mechanics, loading, and roll through of R LE.   - single leg stance with ball toss.3x10 each side. With second foot touch down support as needed.2kg ball. SBA for safety. She removed.   - ambulation with shoes donned focusing on normalized gait pattern as much as possible.    Cuing for improved form, modifications for improved comfort, updated HEP to include more standing and balance exercises.    HOME EXERCISE PROGRAM Access Code: [email or web address]  URL: [email or web address]  Date: 05/14/2019  Prepared by: Rosita Kea   Exercises Seated Great Toe Extension - 2 sets - 20 reps - 3 seconds hold  - 2 2 Minutes - 2x daily - 7x weekly Seated Lesser Toes Extension - 2 sets - 20 reps - 3 seconds hold - 2 2 minutes - 2x daily - 7x weekly Toe Spreading - 2 sets - 20 reps - 3 seconds hold - 2x daily - 7x weekly Arch Lifting - 2 sets - 15 reps - 3 seconds hold - 1-2x daily Seated Ankle Dorsiflexion with Resistance - 1 sets - 20 reps Seated Ankle Eversion with Resistance - 1 sets - 20 reps Seated Ankle Plantar Flexion with Resistance Loop - 1 sets - 20 reps Seated Figure 4 Ankle Inversion with Resistance - 1 sets - 20 reps    Single Leg Balance - 3 reps - 30 seconds hold - 2x daily Patient Education Scar Massage    PT Education - 05/14/19 1335    Education Details  Exercise purpose/form. Self management techniques    Person(s) Educated  Patient    Methods  Explanation;Demonstration;Tactile cues;Verbal cues;Handout    Comprehension  Verbalized understanding;Returned demonstration;Verbal cues required;Tactile cues required;Need further instruction  PT Short Term Goals - 04/22/19 1458      PT SHORT TERM GOAL #1   Title  Be independent with initial home exercise program for self-management of symptoms.    Baseline  Initial HEP provided at IE (04/16/2019);    Time  2    Period  Weeks    Status  Achieved    Target Date  04/30/19        PT Long Term Goals - 05/12/19 1651      PT LONG TERM GOAL #1   Title  Be independent with a long-term home exercise program for self-management of symptoms    Baseline  Initial HEP provided at IE (04/16/2019); currently participating in appropriate HEP    Time  8    Period  Weeks    Status  Partially Met    Target Date  07/07/19      PT LONG TERM GOAL #2   Title  Patient will be able to return to driving and work (09/38/1829)    Baseline  Currently unable to drive or work (93/71/6967); has started driving short distances; has not yet returned to work but is in discussions with supervisor (05/12/2019);    Time  0    Period  Weeks     Status  Achieved    Target Date  07/07/19      PT LONG TERM GOAL #3   Title  Demonstrate improved FOTO score by 10 units to demonstrate improvement in overall condition and self-reported functional ability.    Baseline  FOTO = 56 (04/16/2019); 65 (05/12/2019);    Time  0    Period  Weeks    Status  Partially Met    Target Date  07/07/19      PT LONG TERM GOAL #4   Title  Patient will demonstrate R ankle and great toe A/PROM equal or greater than left ankle and great toe to improve ability to ambulate with normal gait pattern and return to PLOF running, stairs, walking.    Baseline  see objective data (04/16/2019); see objective data (05/12/2019);    Time  8    Period  Weeks    Status  Partially Met    Target Date  07/07/19      PT LONG TERM GOAL #5   Title  Patient will demonstrate equal or greater than 4+/5 strength in all directions of R ankle to improve ability to complete weight bearing activities safely.    Baseline  not tested on R due to surgical precautions (04/16/2019); see objective exam (05/12/2019);    Time  8    Period  Weeks    Status  New    Target Date  07/07/19      PT LONG TERM GOAL #6   Title  Complete community, work and/or recreational activities without limitation due to current condition.    Baseline  prolonged standing, showering, walking, cannot work, cannot drive, running, hiking, any activities requiring weight bearing of the R foot.(04/16/2019); improving with walking boot on but still limited (05/12/2019);    Time  8    Period  Weeks    Status  Partially Met    Target Date  07/07/19            Plan - 05/14/19 1333    Clinical Impression Statement  Patient tolerated treatment well overall and was slightly limited by pain in the R toes (2nd toe most pronounced) with roll over ball of foot. Was  able to progress to more advanced motor control and gait exercises. Demonstrates slight lack of weight shift towards R LE during stance phase of gait.  Decreased force during R toe off and short step length on L LE noted but improved from last session. Updated HEP to reflect progressions in weight bearing. Patient would benefit from continued physical therapy to address remaining impairments and functional limitations to work towards stated goals and return to PLOF or maximal functional independence.    Personal Factors and Comorbidities  Fitness;Past/Current Experience;Time since onset of injury/illness/exacerbation    Examination-Activity Limitations  Squat;Stairs;Lift;Locomotion Level;Stand;Caring for Others;Carry;Transfers   prolonged standing, showering, walking, cannot work, cannot drive, running, hiking, any activities requiring weight bearing of the R foot.   Examination-Participation Restrictions  Cleaning;Shop;Yard Work;Community Activity;Driving;Interpersonal Relationship   work   Stability/Clinical Decision Making  Stable/Uncomplicated    Rehab Potential  Excellent    PT Frequency  2x / week    PT Duration  8 weeks    PT Treatment/Interventions  ADLs/Self Care Home Management;Biofeedback;Cryotherapy;Electrical Stimulation;Moist Heat;Gait training;Stair training;Functional mobility training;Therapeutic activities;Therapeutic exercise;Balance training;Neuromuscular re-education;Patient/family education;Manual techniques;Dry needling;Taping;Passive range of motion;Scar mobilization;Spinal Manipulations;Joint Manipulations    PT Next Visit Plan  progress ROM, strengtheing, proprioception/motor control, scar mobility as tolerated; hip strengthening    PT Home Exercise Plan  Medbridge Access Code: [email or web address]    Consulted and Agree with Plan of Care  Patient       Patient will benefit from skilled therapeutic intervention in order to improve the following deficits and impairments:  Abnormal gait, Decreased balance, Decreased endurance, Decreased mobility, Decreased skin integrity, Difficulty walking, Hypomobility, Increased muscle spasms,  Decreased knowledge of precautions, Decreased range of motion, Decreased scar mobility, Increased edema, Impaired perceived functional ability, Improper body mechanics, Decreased activity tolerance, Decreased coordination, Decreased strength, Increased fascial restricitons, Impaired flexibility, Pain  Visit Diagnosis: Pain in right foot  Stiffness of right foot, not elsewhere classified  Stiffness of right ankle, not elsewhere classified  Muscle weakness (generalized)  Difficulty in walking, not elsewhere classified     Problem List There are no problems to display for this patient.   Everlean Alstrom. Graylon Good, PT, DPT 05/14/19, 1:35 PM  [facility] [address], [place], [address] Phone: [phone]   Fax:  [phone]  Name: Brianna Odonnell MRN: [id number] Date of Birth: 1990/11/08

## 2019-05-19 ENCOUNTER — Encounter: Payer: Self-pay | Admitting: Physical Therapy

## 2019-05-19 ENCOUNTER — Other Ambulatory Visit: Payer: Self-pay

## 2019-05-19 ENCOUNTER — Ambulatory Visit: Payer: Commercial Managed Care - PPO | Admitting: Physical Therapy

## 2019-05-19 DIAGNOSIS — M25671 Stiffness of right ankle, not elsewhere classified: Secondary | ICD-10-CM

## 2019-05-19 DIAGNOSIS — M79671 Pain in right foot: Secondary | ICD-10-CM

## 2019-05-19 DIAGNOSIS — M25674 Stiffness of right foot, not elsewhere classified: Secondary | ICD-10-CM

## 2019-05-19 DIAGNOSIS — M6281 Muscle weakness (generalized): Secondary | ICD-10-CM

## 2019-05-19 DIAGNOSIS — R262 Difficulty in walking, not elsewhere classified: Secondary | ICD-10-CM

## 2019-05-19 NOTE — Therapy (Signed)
[facility] [address] [place], [address] Phone: [phone]   Fax:  [phone]  Physical Therapy Treatment  Patient Details  Name: Brianna Odonnell MRN: [id number] Date of Birth: 1990-10-03 Referring Provider (PT): [name], DPM   Encounter Date: 05/19/2019  PT End of Session - 05/19/19 1745    Visit Number  9    Number of Visits  23    Date for PT Re-Evaluation  07/07/19    Authorization Type  Reporting period from 04/16/2019    Authorization - Visit Number  9    Authorization - Number of Visits  10    PT Start Time  1430    PT Stop Time  1512    PT Time Calculation (min)  42 min    Activity Tolerance  Patient tolerated treatment well    Behavior During Therapy  [facility] for tasks assessed/performed       Past Medical History:  Diagnosis Date  . Anxiety    situational  . Complication of anesthesia   . PONV (postoperative nausea and vomiting)     Past Surgical History:  Procedure Laterality Date  . MINOR HARDWARE REMOVAL Right 05/06/2019   Procedure: REMOVAL SCREW; DEEP;  Surgeon: [name], DPM;  Location: [facility];  Service: Podiatry;  Laterality: Right;  UPREG, LATEX  . MULTIPLE TOOTH EXTRACTIONS  2009  . OPEN REDUCTION INTERNAL FIXATION (ORIF) FOOT LISFRANC FRACTURE Right 02/20/2019   Procedure: LISFRANC;EACH JOINT X 2 RIGHT;  Surgeon: [name], DPM;  Location: [facility];  Service: Podiatry;  Laterality: Right;  GENERAL, POP/SAPH PREOP BLOCK UPREG  . TONSILLECTOMY    . UTERINE SEPTUM RESECTION  2019  . WISDOM TOOTH EXTRACTION      There were no vitals filed for this visit.  Subjective Assessment - 05/19/19 1434    Subjective  Patient arrived in normal shoe today stating she feels good and excited to be out of her shoe. States she felt okay following last treatment session. Being in her shoe she has noticed her ankle is very weak. She has been doing a lot  of heel-toe walking at home and did not have any pain in her toe region.    Pertinent History  Patient is a 28 y.o. female who presents to outpatient physical therapy with a referral for medical diagnosis closed displced fracture of second metatarsal bone of R foot; post op R lisfranc ORIF. This patient's chief complaints consist of decreased function, pain, and stiffness of R foot leading to the following functional deficits: prolonged standing, showering, walking, cannot work, cannot drive, running, hiking, any activities requiring weight bearing of the R foot. Relevant past medical history and comorbidities include DENIES: brain, lung, diabetes, heart problems. .    Limitations  Standing;Walking;House hold activities;Other (comment)   Functional limitations: prolonged standing, showering, walking, cannot work, cannot drive, running, hiking, any activities requiring weight bearing of the R foot.   How long can you sit comfortably?  no limitations    How long can you stand comfortably?  25 min    How long can you walk comfortably?  takes longer and cannot take dogs on a walk.    Diagnostic tests  screw placement  excellent across medial cuneiform to second metatarsal.    Patient Stated Goals  She would like to go back to work and would like to be able to drive. Her doctor thinks it will be after the first  of the year before she can go back to work.    Currently in Pain?  No/denies    Pain Onset  More than a month ago       TREATMENT: S/P ORIF for R lisfranc fracture 02/20/2019; hardware removal 05/06/2019. From Physician:"still ambulate in boot at all times until hardware is removed. Work on range of motonn, scar massage/loosening, strengthening. Once hardware is removed, then work outside of the boot on strength/ROM/gait training/proprioception."  Therapeutic exercise:to centralize symptoms and improve ROM, strength, muscular endurance, and activity tolerance required for successful completion  of functional activities.  - NuStep level 6 using bilateral lower extremities. Seat setting 9 For improved extremity mobility, muscular endurance, and activity tolerance; and to induce the analgesic effect of aerobic exercise, stimulate improved joint nutrition, and prepare body structures and systems for following interventions. x 5 minutes during subjective exam - standing SLS Rankle BAPS board, level3 BUE support. plantarflexion/dorsiflexion, inversion/eversion, 2 min each plus time for instruction and transitions. Pt required cuing to keep knee stillbut good carry over from prior session. - R great toe flexion againstgreentheraband while foot is on floor20 with 3 second hold. To prepare for push off.  - running man 3x10 each side. R standing side modified to have L toe on furnature slider as other versions were too advanced.  - Standing B toe plays with great toe abduction to improve intrinsic foot activation and strength, 2 second hold, x25mn - standing B arch raises/standing inversion with first ray pressure into the ground with red theraband around ankles. 3 second hold. x2 min.  - B heel raises 2x10 (2nd time with LAX ball held between heels) to improve gastroc strength and ankle flexibility.  - self R DF mobilizations with R foot on chair  x20   Cuing for improved form, modifications for improved comfort, reinforced balance exercises as part of HEP and provided blue band to advance 4-way ankle exercise at home.   Manual therapy: to reduce pain and tissue tension, improve range of motion, neuromodulation, in order to promote improved ability to complete functional activities. - long sitting R ankle joint mobilizations grade II-IV to improve motion, subtalar lateral glide, talocrual posterior glide, subtalar and talocrual distraction. Noted for improved accessory motion per end feel as mobilizations continued.    HOME EXERCISE PROGRAM Access Code: [email or web address]     URL:  [email or web address]    Date: 05/14/2019  Prepared by: SRosita Kea  Exercises Seated Great Toe Extension - 2 sets - 20 reps - 3 seconds hold - 2 2 Minutes - 2x daily - 7x weekly Seated Lesser Toes Extension - 2 sets - 20 reps - 3 seconds hold - 2 2 minutes - 2x daily - 7x weekly Toe Spreading - 2 sets - 20 reps - 3 seconds hold - 2x daily - 7x weekly Arch Lifting - 2 sets - 15 reps - 3 seconds hold - 1-2x daily Seated Ankle Dorsiflexion with Resistance - 1 sets - 20 reps Seated Ankle Eversion with Resistance - 1 sets - 20 reps Seated Ankle Plantar Flexion with Resistance Loop - 1 sets - 20 reps Seated Figure 4 Ankle Inversion with Resistance - 1 sets - 20 reps                          Single Leg Balance - 3 reps - 30 seconds hold - 2x daily Patient Education Scar Massage    PT Education - 05/19/19 1745  Education Details  Exercise purpose/form. Self management techniques    Person(s) Educated  Patient    Methods  Explanation;Demonstration;Tactile cues;Verbal cues    Comprehension  Verbalized understanding;Returned demonstration;Verbal cues required;Need further instruction;Tactile cues required       PT Short Term Goals - 04/22/19 1458      PT SHORT TERM GOAL #1   Title  Be independent with initial home exercise program for self-management of symptoms.    Baseline  Initial HEP provided at IE (04/16/2019);    Time  2    Period  Weeks    Status  Achieved    Target Date  04/30/19        PT Long Term Goals - 05/12/19 1651      PT LONG TERM GOAL #1   Title  Be independent with a long-term home exercise program for self-management of symptoms    Baseline  Initial HEP provided at IE (04/16/2019); currently participating in appropriate HEP    Time  8    Period  Weeks    Status  Partially Met    Target Date  07/07/19      PT LONG TERM GOAL #2   Title  Patient will be able to return to driving and work (40/76/8088)    Baseline  Currently unable to drive  or work (03/31/1593); has started driving short distances; has not yet returned to work but is in discussions with supervisor (05/12/2019);    Time  0    Period  Weeks    Status  Achieved    Target Date  07/07/19      PT LONG TERM GOAL #3   Title  Demonstrate improved FOTO score by 10 units to demonstrate improvement in overall condition and self-reported functional ability.    Baseline  FOTO = 56 (04/16/2019); 65 (05/12/2019);    Time  0    Period  Weeks    Status  Partially Met    Target Date  07/07/19      PT LONG TERM GOAL #4   Title  Patient will demonstrate R ankle and great toe A/PROM equal or greater than left ankle and great toe to improve ability to ambulate with normal gait pattern and return to PLOF running, stairs, walking.    Baseline  see objective data (04/16/2019); see objective data (05/12/2019);    Time  8    Period  Weeks    Status  Partially Met    Target Date  07/07/19      PT LONG TERM GOAL #5   Title  Patient will demonstrate equal or greater than 4+/5 strength in all directions of R ankle to improve ability to complete weight bearing activities safely.    Baseline  not tested on R due to surgical precautions (04/16/2019); see objective exam (05/12/2019);    Time  8    Period  Weeks    Status  New    Target Date  07/07/19      PT LONG TERM GOAL #6   Title  Complete community, work and/or recreational activities without limitation due to current condition.    Baseline  prolonged standing, showering, walking, cannot work, cannot drive, running, hiking, any activities requiring weight bearing of the R foot.(04/16/2019); improving with walking boot on but still limited (05/12/2019);    Time  8    Period  Weeks    Status  Partially Met    Target Date  07/07/19  Plan - 05/19/19 1744    Clinical Impression Statement  Patient tolerated treatment well and continues to make progress towards goals. Demonstrated very impaired proprioception on the R  LE that made it difficult for her to balance on one foot or control BAPS board on one foot but she was able to continue without pain beyond slight intermittent discomfort. Today's session focused on improving ankle and foot strength, control, and motion for functional activities. Pt required multimodal cuing for proper technique and to facilitate improved neuromuscular control, strength, range of motion, and functional ability resulting in improved performance and form. Patient would benefit from continued physical therapy to address remaining impairments and functional limitations to work towards stated goals and return to PLOF or maximal functional independence.    Personal Factors and Comorbidities  Fitness;Past/Current Experience;Time since onset of injury/illness/exacerbation    Examination-Activity Limitations  Squat;Stairs;Lift;Locomotion Level;Stand;Caring for Others;Carry;Transfers   prolonged standing, showering, walking, cannot work, cannot drive, running, hiking, any activities requiring weight bearing of the R foot.   Examination-Participation Restrictions  Cleaning;Shop;Yard Work;Community Activity;Driving;Interpersonal Relationship   work   Stability/Clinical Decision Making  Stable/Uncomplicated    Rehab Potential  Excellent    PT Frequency  2x / week    PT Duration  8 weeks    PT Treatment/Interventions  ADLs/Self Care Home Management;Biofeedback;Cryotherapy;Electrical Stimulation;Moist Heat;Gait training;Stair training;Functional mobility training;Therapeutic activities;Therapeutic exercise;Balance training;Neuromuscular re-education;Patient/family education;Manual techniques;Dry needling;Taping;Passive range of motion;Scar mobilization;Spinal Manipulations;Joint Manipulations    PT Next Visit Plan  progress ROM, strengtheing, weight bearing, proprioception/motor control, scar mobility as tolerated; hip strengthening    PT Home Exercise Plan  Medbridge Access Code: [email or web address]     Consulted and Agree with Plan of Care  Patient       Patient will benefit from skilled therapeutic intervention in order to improve the following deficits and impairments:  Abnormal gait, Decreased balance, Decreased endurance, Decreased mobility, Decreased skin integrity, Difficulty walking, Hypomobility, Increased muscle spasms, Decreased knowledge of precautions, Decreased range of motion, Decreased scar mobility, Increased edema, Impaired perceived functional ability, Improper body mechanics, Decreased activity tolerance, Decreased coordination, Decreased strength, Increased fascial restricitons, Impaired flexibility, Pain  Visit Diagnosis: Pain in right foot  Stiffness of right foot, not elsewhere classified  Stiffness of right ankle, not elsewhere classified  Muscle weakness (generalized)  Difficulty in walking, not elsewhere classified     Problem List There are no problems to display for this patient.   Everlean Alstrom. Graylon Good, PT, DPT 05/19/19, 5:46 PM  [facility] [address], [place], [address] Phone: [phone]   Fax:  [phone]  Name: VAEDA WESTALL MRN: [id number] Date of Birth: 1991/02/13

## 2019-05-21 ENCOUNTER — Encounter: Payer: Self-pay | Admitting: Physical Therapy

## 2019-05-21 ENCOUNTER — Ambulatory Visit: Payer: Commercial Managed Care - PPO | Admitting: Physical Therapy

## 2019-05-21 ENCOUNTER — Other Ambulatory Visit: Payer: Self-pay

## 2019-05-21 DIAGNOSIS — M79671 Pain in right foot: Secondary | ICD-10-CM | POA: Diagnosis not present

## 2019-05-21 DIAGNOSIS — M25671 Stiffness of right ankle, not elsewhere classified: Secondary | ICD-10-CM

## 2019-05-21 DIAGNOSIS — R262 Difficulty in walking, not elsewhere classified: Secondary | ICD-10-CM

## 2019-05-21 DIAGNOSIS — M6281 Muscle weakness (generalized): Secondary | ICD-10-CM

## 2019-05-21 DIAGNOSIS — M25674 Stiffness of right foot, not elsewhere classified: Secondary | ICD-10-CM

## 2019-05-21 NOTE — Therapy (Signed)
[facility] [address] [place], [address] Phone: [phone]   Fax:  [phone]  Physical Therapy Treatment / Progress Note Reporting period: 04/16/2019 - 05/21/2019  Patient Details  Name: Brianna Odonnell MRN: [id number] Date of Birth: 1991-05-15 Referring Provider (PT): [name], DPM   Encounter Date: 05/21/2019  PT End of Session - 05/21/19 1121    Visit Number  10    Number of Visits  23    Date for PT Re-Evaluation  07/07/19    Authorization Type  Reporting period from 04/16/2019    Authorization - Visit Number  10    Authorization - Number of Visits  10    PT Start Time  1118    PT Stop Time  1200    PT Time Calculation (min)  42 min    Activity Tolerance  Patient tolerated treatment well    Behavior During Therapy  [facility] for tasks assessed/performed       Past Medical History:  Diagnosis Date  . Anxiety    situational  . Complication of anesthesia   . PONV (postoperative nausea and vomiting)     Past Surgical History:  Procedure Laterality Date  . MINOR HARDWARE REMOVAL Right 05/06/2019   Procedure: REMOVAL SCREW; DEEP;  Surgeon: [name], DPM;  Location: [facility];  Service: Podiatry;  Laterality: Right;  UPREG, LATEX  . MULTIPLE TOOTH EXTRACTIONS  2009  . OPEN REDUCTION INTERNAL FIXATION (ORIF) FOOT LISFRANC FRACTURE Right 02/20/2019   Procedure: LISFRANC;EACH JOINT X 2 RIGHT;  Surgeon: [name], DPM;  Location: [facility] [facility];  Service: Podiatry;  Laterality: Right;  GENERAL, POP/SAPH PREOP BLOCK UPREG  . TONSILLECTOMY    . UTERINE SEPTUM RESECTION  2019  . WISDOM TOOTH EXTRACTION      There were no vitals filed for this visit.  Subjective Assessment - 05/21/19 1257    Subjective  Patient reports no pain upon arrival. 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 She reports no excessive soreness or pain following last treatment session. No pain upon arrival.    Pertinent History  Patient is a 28 y.o. female who presents to outpatient physical therapy with a referral for medical diagnosis closed displced fracture of second metatarsal bone of R foot; post op R lisfranc ORIF. This patient's chief complaints consist of decreased function, pain, and stiffness of R foot leading to the following functional deficits: prolonged standing, showering, walking, cannot work, cannot drive, running, hiking, any activities requiring weight bearing of the R foot. Relevant past medical history and comorbidities include DENIES: brain, lung, diabetes, heart problems. .    Limitations  Standing;Walking;House hold activities;Other (comment)   Functional limitations: prolonged standing, showering, walking, cannot work, cannot drive, running, hiking, any activities requiring weight bearing of the R foot.   How long can you sit comfortably?  no limitations    How long can you stand comfortably?  25 min    How long can you walk comfortably?  takes longer and cannot take dogs on a walk.    Diagnostic tests  screw placement  excellent across medial cuneiform to second metatarsal.    Patient Stated Goals  She would like to go back to work and would like to be able to drive. Her doctor thinks it will be after the first  of the year before she can go back to work.    Currently in Pain?  No/denies    Pain Onset  More than a month ago       [facility] PT Assessment - 05/21/19 0001      Assessment   Medical Diagnosis  Closed displced fracture of second metatarsal bone of R foot; post op R lisfranc ORIF    Referring Provider (PT)  [name], DPM    Onset Date/Surgical Date  02/20/19   fractured 01/30/2019   Next MD Visit  04/30/2019    Prior Therapy  not for this problem prior to this episode of care      Precautions   Precautions  None   no weight bearing out of the [place] residence    Living Arrangements  Parent;Other relatives    Home Access  --   no concerns at this point getting around her home     Prior Function   Level of Independence  Independent    Vocation  Full time employment    Vocation Requirements  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 12 hour shifts 3x a week. She works in     Leisure  traveling, going to the [place], read a lot, hang out with freinds and boyfreind, play with dogs      Cognition   Overall Cognitive Status  Within Functional Limits for tasks assessed      Observation/Other Assessments   Observations  see note from 05/21/2019 for latest objective data    Focus on Therapeutic Outcomes (FOTO)   FOTO = 75 (05/21/2019);         OBJECTIVE  OBSERVATION/INSPECTION  Tremor: none  Muscle bulk: generallyWFL bilaterally  Skin: unable to see incision site because of bandage. Checked by MD today.    Gait: grossly WFL for household and short community ambulationwhile wearing shoe. Demonstrates decreased R tibial translation, decreased heel lift and foot push off resulting in antalgic halting gait favoring R LE.   PERIPHERAL JOINT MOTION (in degrees) *Indicates pain, R/L Date    Joint/Motion AROM PROM Comments  Ankle/Foot     Dorsiflexion (knee ext) 5/0 13/10   Dorsiflexion (knee flex) 10/10 15/10   Plantarflexion 70/76 /   Everison 15/35 /   Inversion 50/50 /   Great toe ext / 100/110 metatarsals to phalanges   Comments:mild pulling on right side with all motions. B hips and knees grossly WFL.  MUSCLE PERFORMANCE (MMT): B hips, knees, equal and WFL L ankle 5/5 all directions R ankle   PF = 4/5* (able to complete double leg heel raises, 2x10)  DF = 4*/5  Inversion = 4*/5  Eversion = 4+/5  Pronation = 3+*/5 (limited pressure)  ACCESSORY  MOTION:  Deferred due to recent surgery  FUNCTIONAL MOBILITY/TESTING:  Bed mobility:WFL  Transfers:WFL with no shoe  6MWT: 1376  SLS EO: L = > 30 seconds; R = 24 seconds (increased sway)  SLS EC: L = 28 seconds (increased sway); R = 4 (wild sway/bracing on CL leg).   EDUCATION/COGNITION: Patient is alert and oriented X 4.  Objective measurements completed on examination: See above findings.   TREATMENT: S/P ORIF for R lisfranc fracture 02/20/2019; hardware removal 05/06/2019. From Physician:"still ambulate in boot at all times until hardware is removed.  Work on range of motonn, scar massage/loosening, strengthening. Once hardware is removed, then work outside of the boot on strength/ROM/gait training/proprioception."  Therapeutic exercise:to centralize symptoms and improve ROM, strength, muscular endurance, and activity tolerance required for successful completion of functional activities.  -Six Minute Walk Test (see above) with shoe donned to improve gait, activity tolerance, and prepare tissue and joints for emainder of session.  - SLS testing (See above) to assess progress - running man 3x10 each side. R standing side modified to have L toe on furnature slider as other versions were too advanced.  - self R DF mobilizations with R foot on chair  x20  (manual therapy - see below) - ROM measurements (see above) to assess progress - B heel raises x25 to improve gastroc strength and ankle flexibility.  -Rgreat toe flexion againstgreentheraband while foot is on floor 2x10 with 3 secondhold. To prepare for push off. progressing towards R single leg stance.   Updated and reviewed HEP   Cuing for improved form, modifications for improved comfort. Updated HEP  Manual therapy: to reduce pain and tissue tension, improve range of motion, neuromodulation, in order to promote improved ability to complete functional activities. - long sitting R ankle joint  mobilizations grade II-IV to improve motion, subtalar lateral glide, talocrual posterior glide, subtalar and talocrual distraction. Noted for improved accessory motion per end feel as mobilizations continued.   HOME EXERCISE PROGRAM Access Code: [email or web address]  URL: [email or web address]  Date: 05/21/2019  Prepared by: Rosita Kea   Exercises Arch Lifting - 2 sets - 15 reps - 3 seconds hold - 1-2x daily Half Kneel Ankle Dorsiflexion Self-Mobilization - 20 reps - 5 second hold hold - 1x daily Single Leg Running Balance - 3 sets - 10 reps - 1x daily Heel Raise - 2-3 sets - 10 reps - 1 every other day - 1x daily Patient Education Scar Massage        PT Education - 05/21/19 1438    Education Details  Exercise purpose/form. Self management techniques. POC    Person(s) Educated  Patient    Methods  Explanation;Demonstration;Tactile cues;Handout;Verbal cues    Comprehension  Verbalized understanding;Returned demonstration;Verbal cues required;Tactile cues required;Need further instruction       PT Short Term Goals - 04/22/19 1458      PT SHORT TERM GOAL #1   Title  Be independent with initial home exercise program for self-management of symptoms.    Baseline  Initial HEP provided at IE (04/16/2019);    Time  2    Period  Weeks    Status  Achieved    Target Date  04/30/19        PT Long Term Goals - 05/21/19 1306      PT LONG TERM GOAL #1   Title  Be independent with a long-term home exercise program for self-management of symptoms    Baseline  Initial HEP provided at IE (04/16/2019); currently participating in appropriate HEP    Time  8    Period  Weeks    Status  Partially Met    Target Date  07/07/19      PT LONG TERM GOAL #2   Title  Patient will be able to return to driving and work (18/56/3149)    Baseline  Currently unable to drive or work (70/26/3785); has started driving short distances; has not yet returned to work but is in discussions with supervisor  (05/12/2019); now driving, plans to return to work but not yet full  capacity (05/20/2020);    Time  8    Period  Weeks    Status  Partially Met    Target Date  07/07/19      PT LONG TERM GOAL #3   Title  Demonstrate improved FOTO score by 10 units to demonstrate improvement in overall condition and self-reported functional ability.    Baseline  FOTO = 56 (04/16/2019); 65 (05/12/2019); 75 (05/21/2019);    Time  8    Period  Weeks    Status  Partially Met    Target Date  07/06/18      PT LONG TERM GOAL #4   Title  Patient will demonstrate R ankle and great toe A/PROM equal or greater than left ankle and great toe to improve ability to ambulate with normal gait pattern and return to PLOF running, stairs, walking.    Baseline  see objective data (04/16/2019); see objective data (05/12/2019);    Time  8    Period  Weeks    Status  Partially Met    Target Date  07/07/19      PT LONG TERM GOAL #5   Title  Patient will demonstrate equal or greater than 4+/5 strength in all directions of R ankle to improve ability to complete weight bearing activities safely.    Baseline  not tested on R due to surgical precautions (04/16/2019); see objective exam (05/12/2019);    Time  8    Period  Weeks    Status  Partially Met    Target Date  07/07/19      PT LONG TERM GOAL #6   Title  Complete community, work and/or recreational activities without limitation due to current condition.    Baseline  prolonged standing, showering, walking, cannot work, cannot drive, running, hiking, any activities requiring weight bearing of the R foot.(04/16/2019); improving with walking boot on but still limited (05/12/2019); now able to drive, walking in shoe with mild antalgic gait, working on returning to work with plans to go back but not full capacity next week (12/23/20200);    Time  8    Period  Weeks    Status  Partially Met    Target Date  07/07/19            Plan - 05/21/19 1306    Clinical Impression  Statement  Patient has attended 10 physical therapy sessions and continues to make great progress towards goals. She has improved in ROM, functional activity tolerance, weight bearing, strength, and balance. Subjectively she report being able to schedule initial return to work, and improved ambulation, standing tolerance, and decreased functional deficit. Patient continues to have pain. Patient presents with significant stiffness, ROM, strength, coordination, stability, proprioceptive, activity tolerance, balance, gait, and muscle performance impairments that are limiting ability to complete all activities requiring R LE weight bearing including prolonged standing, showering, walking, working, driving, running, hiking without difficulty. Patient will benefit from skilled physical therapy intervention to address current body structure impairments and activity limitations to improve function and work towards goals set in current POC in order to return to prior level of function or maximal functional    Personal Factors and Comorbidities  Fitness;Past/Current Experience;Time since onset of injury/illness/exacerbation    Examination-Activity Limitations  Squat;Stairs;Lift;Locomotion Level;Stand;Caring for Others;Carry;Transfers   prolonged standing, showering, walking, cannot work, cannot drive, running, hiking, any activities requiring weight bearing of the R foot.   Examination-Participation Restrictions  Cleaning;Shop;Yard Work;Community Activity;Driving;Interpersonal Relationship   work   [employer]  Stable/Uncomplicated    Rehab Potential  Excellent    PT Frequency  2x / week    PT Duration  8 weeks    PT Treatment/Interventions  ADLs/Self Care Home Management;Biofeedback;Cryotherapy;Electrical Stimulation;Moist Heat;Gait training;Stair training;Functional mobility training;Therapeutic activities;Therapeutic exercise;Balance training;Neuromuscular re-education;Patient/family  education;Manual techniques;Dry needling;Taping;Passive range of motion;Scar mobilization;Spinal Manipulations;Joint Manipulations    PT Next Visit Plan  progress ROM, strengtheing, weight bearing, proprioception/motor control, scar mobility as tolerated; hip strengthening    PT Home Exercise Plan  Medbridge Access Code: [email or web address]    Consulted and Agree with Plan of Care  Patient       Patient will benefit from skilled therapeutic intervention in order to improve the following deficits and impairments:  Abnormal gait, Decreased balance, Decreased endurance, Decreased mobility, Decreased skin integrity, Difficulty walking, Hypomobility, Increased muscle spasms, Decreased knowledge of precautions, Decreased range of motion, Decreased scar mobility, Increased edema, Impaired perceived functional ability, Improper body mechanics, Decreased activity tolerance, Decreased coordination, Decreased strength, Increased fascial restricitons, Impaired flexibility, Pain  Visit Diagnosis: Pain in right foot  Stiffness of right foot, not elsewhere classified  Stiffness of right ankle, not elsewhere classified  Muscle weakness (generalized)  Difficulty in walking, not elsewhere classified     Problem List There are no problems to display for this patient.   Everlean Alstrom. Graylon Good, PT, DPT 05/21/19, 2:38 PM  [facility] [address] [place], [address] Phone: [phone]   Fax:  [phone]  Name: Brianna Odonnell MRN: [id number] Date of Birth: 1990-12-12

## 2019-05-26 ENCOUNTER — Other Ambulatory Visit: Payer: Self-pay

## 2019-05-26 ENCOUNTER — Ambulatory Visit: Payer: Commercial Managed Care - PPO | Admitting: Physical Therapy

## 2019-05-26 DIAGNOSIS — M25671 Stiffness of right ankle, not elsewhere classified: Secondary | ICD-10-CM

## 2019-05-26 DIAGNOSIS — R262 Difficulty in walking, not elsewhere classified: Secondary | ICD-10-CM

## 2019-05-26 DIAGNOSIS — M6281 Muscle weakness (generalized): Secondary | ICD-10-CM

## 2019-05-26 DIAGNOSIS — M25674 Stiffness of right foot, not elsewhere classified: Secondary | ICD-10-CM

## 2019-05-26 DIAGNOSIS — M79671 Pain in right foot: Secondary | ICD-10-CM | POA: Diagnosis not present

## 2019-05-26 NOTE — Therapy (Signed)
[facility] [address], [place], [address] Phone: [phone]   Fax:  [phone]  Physical Therapy Treatment  Patient Details  Name: Brianna Odonnell MRN: [id number] Date of Birth: 1991-01-22 Referring Provider (PT): [name], DPM   Encounter Date: 05/26/2019  PT End of Session - 05/27/19 1109    Visit Number  11    Number of Visits  23    Date for PT Re-Evaluation  07/07/19    Authorization Type  Reporting period from 04/16/2019    Authorization - Visit Number  1    Authorization - Number of Visits  10    PT Start Time  1435    PT Stop Time  1515    PT Time Calculation (min)  40 min    Activity Tolerance  Patient tolerated treatment well    Behavior During Therapy  [facility] for tasks assessed/performed       Past Medical History:  Diagnosis Date  . Anxiety    situational  . Complication of anesthesia   . PONV (postoperative nausea and vomiting)     Past Surgical History:  Procedure Laterality Date  . MINOR HARDWARE REMOVAL Right 05/06/2019   Procedure: REMOVAL SCREW; DEEP;  Surgeon: [name], DPM;  Location: [place];  Service: Podiatry;  Laterality: Right;  UPREG, LATEX  . MULTIPLE TOOTH EXTRACTIONS  2009  . OPEN REDUCTION INTERNAL FIXATION (ORIF) FOOT LISFRANC FRACTURE Right 02/20/2019   Procedure: LISFRANC;EACH JOINT X 2 RIGHT;  Surgeon: [name], DPM;  Location: [place];  Service: Podiatry;  Laterality: Right;  GENERAL, POP/SAPH PREOP BLOCK UPREG  . TONSILLECTOMY    . UTERINE SEPTUM RESECTION  2019  . WISDOM TOOTH EXTRACTION      There were no vitals filed for this visit.  Subjective Assessment - 05/26/19 1438    Subjective  Patient reports she is feeling some dull soreness that she does not rate as pain after working last night (12 hour shift). She neede to take pain medication about 4 hours into her shift due to pain. Denies any specific site of pain.  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    Pertinent History  Patient is a 28 y.o. female who presents to outpatient physical therapy with a referral for medical diagnosis closed displced fracture of second metatarsal bone of R foot; post op R lisfranc ORIF. This patient's chief complaints consist of decreased function, pain, and stiffness of R foot leading to the following functional deficits: prolonged standing, showering, walking, cannot work, cannot drive, running, hiking, any activities requiring weight bearing of the R foot. Relevant past medical history and comorbidities include DENIES: brain, lung, diabetes, heart problems. .    Limitations  Standing;Walking;House hold activities;Other (comment)   Functional limitations: prolonged standing, showering, walking, cannot work, cannot drive, running, hiking, any activities requiring weight bearing of the R foot.   How long can you sit comfortably?  no limitations    How long can you stand comfortably?  25 min    How long can you walk comfortably?  takes longer and cannot take dogs on a walk.    Diagnostic tests  screw placement  excellent across medial cuneiform to second metatarsal.    Patient Stated Goals  She would like to go back to work and would like to be able to drive. Her doctor thinks it will be after the first of the year before she can go back  to work.    Currently in Pain?  Yes    Pain Score  1     Pain Onset  More than a month ago       TREATMENT: S/P ORIF for R lisfranc fracture 02/20/2019; hardware removal 05/06/2019. From Physician:"still ambulate in boot at all times until hardware is removed. Work on range of motonn, scar massage/loosening, strengthening. Once hardware is removed, then work outside of the boot on strength/ROM/gait training/proprioception."  Therapeutic exercise:to centralize symptoms and improve ROM, strength, muscular endurance, and activity tolerance required for successful completion of functional  activities.  - NuStep level 6 using only lower extremities. Seat setting 9. For improved extremity mobility, muscular endurance, and activity tolerance; and to induce the analgesic effect of aerobic exercise, stimulate improved joint nutrition, and prepare body structures and systems for following interventions. X 5  Minutes during subjective exam. Noted for global fatigue.   (manual therapy - see below)  - running man 3x10 each side. R standing side modified to have L toe on furnature slider as other versions were too advanced.with red theraband under medial portion of R forefoot and put on stretch to encourage good medial forefoot contact. x10 each side with no band.  - side stepping with yellow theraband around ankles with focus on keeping subtalar neutral during weight bearing, 2x20 feet.  - diagonal step and hold/ balance alternating sides 2x20 feet forward and 2x20 feet backwards.  - L anterior heel tap of 6 inch step with BUE support ~ x15 (increased pain at anterior R ankle with attempts to remove UE support).  - SLS with pass arounds using 20# kettlebell, 2x10 each leg for improved proprioception.      Cuing for improved form, modifications for improved comfort. Updated HEP  Manual therapy:to reduce pain and tissue tension, improve range of motion, neuromodulation, in order to promote improved ability to complete functional activities. - long sitting R ankle joint mobilizations grade II-IV to improve motion, subtalar lateral glide, talocrual posterior glide, subtalar and talocrual distraction. Manual PROM stretching into dorsiflexion with great and small toe extension.   HOME EXERCISE PROGRAM Access Code: [email or web address]      URL: [email or web address]    Date: 05/21/2019  Prepared by: Rosita Kea   Exercises Arch Lifting - 2 sets - 15 reps - 3 seconds hold - 1-2x daily Half Kneel Ankle Dorsiflexion Self-Mobilization - 20 reps - 5 second hold hold - 1x daily Single  Leg Running Balance - 3 sets - 10 reps - 1x daily Heel Raise - 2-3 sets - 10 reps - 1 every other day - 1x daily Patient Education Scar Massage       PT Education - 05/26/19 1441    Education Details  Exercise purpose/form. Self management techniques.    Person(s) Educated  Patient    Methods  Explanation;Demonstration;Tactile cues;Verbal cues    Comprehension  Verbalized understanding;Returned demonstration;Verbal cues required;Tactile cues required;Need further instruction       PT Short Term Goals - 04/22/19 1458      PT SHORT TERM GOAL #1   Title  Be independent with initial home exercise program for self-management of symptoms.    Baseline  Initial HEP provided at IE (04/16/2019);    Time  2    Period  Weeks    Status  Achieved    Target Date  04/30/19        PT Long Term Goals - 05/21/19 1306      PT LONG TERM GOAL #  1   Title  Be independent with a long-term home exercise program for self-management of symptoms    Baseline  Initial HEP provided at IE (04/16/2019); currently participating in appropriate HEP    Time  8    Period  Weeks    Status  Partially Met    Target Date  07/07/19      PT LONG TERM GOAL #2   Title  Patient will be able to return to driving and work (67/04/4579)    Baseline  Currently unable to drive or work (99/83/3825); has started driving short distances; has not yet returned to work but is in discussions with supervisor (05/12/2019); now driving, plans to return to work but not yet full capacity (05/20/2020);    Time  8    Period  Weeks    Status  Partially Met    Target Date  07/07/19      PT LONG TERM GOAL #3   Title  Demonstrate improved FOTO score by 10 units to demonstrate improvement in overall condition and self-reported functional ability.    Baseline  FOTO = 56 (04/16/2019); 65 (05/12/2019); 75 (05/21/2019);    Time  8    Period  Weeks    Status  Partially Met    Target Date  07/06/18      PT LONG TERM GOAL #4   Title   Patient will demonstrate R ankle and great toe A/PROM equal or greater than left ankle and great toe to improve ability to ambulate with normal gait pattern and return to PLOF running, stairs, walking.    Baseline  see objective data (04/16/2019); see objective data (05/12/2019);    Time  8    Period  Weeks    Status  Partially Met    Target Date  07/07/19      PT LONG TERM GOAL #5   Title  Patient will demonstrate equal or greater than 4+/5 strength in all directions of R ankle to improve ability to complete weight bearing activities safely.    Baseline  not tested on R due to surgical precautions (04/16/2019); see objective exam (05/12/2019);    Time  8    Period  Weeks    Status  Partially Met    Target Date  07/07/19      PT LONG TERM GOAL #6   Title  Complete community, work and/or recreational activities without limitation due to current condition.    Baseline  prolonged standing, showering, walking, cannot work, cannot drive, running, hiking, any activities requiring weight bearing of the R foot.(04/16/2019); improving with walking boot on but still limited (05/12/2019); now able to drive, walking in shoe with mild antalgic gait, working on returning to work with plans to go back but not full capacity next week (12/23/20200);    Time  8    Period  Weeks    Status  Partially Met    Target Date  07/07/19            Plan - 05/27/19 1112    Clinical Impression Statement  Patient tolerated treatment well and continues to make progress towards goals. She continues to be challenged by single leg exercises but demonstrates improving control. Patient was able to complete all exercises with global soreness but no sharp pain. Did have some intermittently increased pain with heel tapping exercise, so discontinued for this session. Patient would benefit from continued physical therapy to address remaining impairments and functional limitations to work towards stated goals  and return to PLOF  or maximal functional independence.    Personal Factors and Comorbidities  Fitness;Past/Current Experience;Time since onset of injury/illness/exacerbation    Examination-Activity Limitations  Squat;Stairs;Lift;Locomotion Level;Stand;Caring for Others;Carry;Transfers   prolonged standing, showering, walking, cannot work, cannot drive, running, hiking, any activities requiring weight bearing of the R foot.   Examination-Participation Restrictions  Cleaning;Shop;Yard Work;Community Activity;Driving;Interpersonal Relationship   work   Stability/Clinical Decision Making  Stable/Uncomplicated    Rehab Potential  Excellent    PT Frequency  2x / week    PT Duration  8 weeks    PT Treatment/Interventions  ADLs/Self Care Home Management;Biofeedback;Cryotherapy;Electrical Stimulation;Moist Heat;Gait training;Stair training;Functional mobility training;Therapeutic activities;Therapeutic exercise;Balance training;Neuromuscular re-education;Patient/family education;Manual techniques;Dry needling;Taping;Passive range of motion;Scar mobilization;Spinal Manipulations;Joint Manipulations    PT Next Visit Plan  progress ROM, strengtheing, weight bearing, proprioception/motor control, scar mobility as tolerated; hip strengthening    PT Home Exercise Plan  Medbridge Access Code: [email or web address]    Consulted and Agree with Plan of Care  Patient       Patient will benefit from skilled therapeutic intervention in order to improve the following deficits and impairments:  Abnormal gait, Decreased balance, Decreased endurance, Decreased mobility, Decreased skin integrity, Difficulty walking, Hypomobility, Increased muscle spasms, Decreased knowledge of precautions, Decreased range of motion, Decreased scar mobility, Increased edema, Impaired perceived functional ability, Improper body mechanics, Decreased activity tolerance, Decreased coordination, Decreased strength, Increased fascial restricitons, Impaired flexibility,  Pain  Visit Diagnosis: Pain in right foot  Stiffness of right foot, not elsewhere classified  Stiffness of right ankle, not elsewhere classified  Muscle weakness (generalized)  Difficulty in walking, not elsewhere classified     Problem List There are no problems to display for this patient.  Everlean Alstrom. Graylon Good, PT, DPT 05/27/19, 11:12 AM  [facility] [address] [place], [address] Phone: [phone]   Fax:  [phone]  Name: Brianna Odonnell MRN: [id number] Date of Birth: 04-03-1991

## 2019-05-27 ENCOUNTER — Encounter: Payer: Self-pay | Admitting: Physical Therapy

## 2019-05-28 ENCOUNTER — Encounter: Payer: Commercial Managed Care - PPO | Admitting: Physical Therapy

## 2019-05-29 ENCOUNTER — Ambulatory Visit: Payer: Commercial Managed Care - PPO | Admitting: Physical Therapy

## 2019-06-03 ENCOUNTER — Encounter: Payer: Self-pay | Admitting: Physical Therapy

## 2019-06-03 ENCOUNTER — Ambulatory Visit: Payer: Commercial Managed Care - PPO | Attending: Podiatry | Admitting: Physical Therapy

## 2019-06-03 ENCOUNTER — Other Ambulatory Visit: Payer: Self-pay

## 2019-06-03 DIAGNOSIS — M25671 Stiffness of right ankle, not elsewhere classified: Secondary | ICD-10-CM

## 2019-06-03 DIAGNOSIS — R262 Difficulty in walking, not elsewhere classified: Secondary | ICD-10-CM | POA: Insufficient documentation

## 2019-06-03 DIAGNOSIS — M25674 Stiffness of right foot, not elsewhere classified: Secondary | ICD-10-CM

## 2019-06-03 DIAGNOSIS — M6281 Muscle weakness (generalized): Secondary | ICD-10-CM | POA: Diagnosis present

## 2019-06-03 DIAGNOSIS — M79671 Pain in right foot: Secondary | ICD-10-CM

## 2019-06-03 NOTE — Therapy (Addendum)
[facility] [address], [place], [address] Phone: [phone]   Fax:  [phone]  Physical Therapy Treatment  Patient Details  Name: Brianna Odonnell MRN: [id number] Date of Birth: [birth year] Referring Provider (PT): [name], DPM   Encounter Date: 06/03/2019  PT End of Session - 06/03/19 1840    Visit Number  12    Number of Visits  23    Date for PT Re-Evaluation  07/07/19    Authorization Type  Reporting period from 04/16/2019    Authorization - Visit Number  2    Authorization - Number of Visits  10    PT Start Time  1520    PT Stop Time  1600    PT Time Calculation (min)  40 min    Activity Tolerance  Patient tolerated treatment well    Behavior During Therapy  [facility] for tasks assessed/performed       Past Medical History:  Diagnosis Date  . Anxiety    situational  . Complication of anesthesia   . PONV (postoperative nausea and vomiting)     Past Surgical History:  Procedure Laterality Date  . MINOR HARDWARE REMOVAL Right 05/06/2019   Procedure: REMOVAL SCREW; DEEP;  Surgeon: [name], DPM;  Location: [place];  Service: Podiatry;  Laterality: Right;  UPREG, LATEX  . MULTIPLE TOOTH EXTRACTIONS  2009  . OPEN REDUCTION INTERNAL FIXATION (ORIF) FOOT LISFRANC FRACTURE Right 02/20/2019   Procedure: LISFRANC;EACH JOINT X 2 RIGHT;  Surgeon: [name], DPM;  Location: [place];  Service: Podiatry;  Laterality: Right;  GENERAL, POP/SAPH PREOP BLOCK UPREG  . TONSILLECTOMY    . UTERINE SEPTUM RESECTION  2019  . WISDOM TOOTH EXTRACTION      There were no vitals filed for this visit.  Subjective Assessment - 06/03/19 1838    Subjective  Patient reports no pain upon arrival today but is noticing she continues to limp slightly despit feeling she is not doing this out of pain. States she canceled her last PT appointment because it was the day after her second day in a  row working that made it hard to sleep and her foot was uncomfortable. States it is more of a general discomfort and felt like she was still walking on it all night even though she wasn't. States she gets some discomfort in her second toe that can spread to her medial arch if she continues to perform exercises like heel raises too much.    Pertinent History  Patient is a 29 y.o. female who presents to outpatient physical therapy with a referral for medical diagnosis closed displced fracture of second metatarsal bone of R foot; post op R lisfranc ORIF. This patient's chief complaints consist of decreased function, pain, and stiffness of R foot leading to the following functional deficits: prolonged standing, showering, walking, cannot work, cannot drive, running, hiking, any activities requiring weight bearing of the R foot. Relevant past medical history and comorbidities include DENIES: brain, lung, diabetes, heart problems. .    Limitations  Standing;Walking;House hold activities;Other (comment)   Functional limitations: prolonged standing, showering, walking, cannot work, cannot drive, running, hiking, any activities requiring weight bearing of the R foot.   How long can you sit comfortably?  no limitations    How long can you stand comfortably?  25 min    How long can you walk comfortably?  takes longer and cannot take dogs on a walk.  Diagnostic tests  screw placement  excellent across medial cuneiform to second metatarsal.    Patient Stated Goals  She would like to go back to work and would like to be able to drive. Her doctor thinks it will be after the first of the year before she can go back to work.    Currently in Pain?  No/denies    Pain Onset  More than a month ago       TREATMENT: S/P ORIF for R lisfranc fracture 02/20/2019; hardware removal 05/06/2019. From Physician:"still ambulate in boot at all times until hardware is removed. Work on range of motonn, scar massage/loosening,  strengthening. Once hardware is removed, then work outside of the boot on strength/ROM/gait training/proprioception."  Therapeutic exercise:to centralize symptoms and improve ROM, strength, muscular endurance, and activity tolerance required for successful completion of functional activities. - Treadmill 1.6 mph 0% grade. For improved lower extremity mobility, muscular endurance, and weightbearing activity tolerance; and to induce the analgesic effect of aerobic exercise, stimulate improved joint nutrition, and prepare body structures and systems for following interventions. X 6.5  Minutes during subjective exam.   (manual therapy - see below)  - Walking with push off at toe off x 40 feet (reports some pain in 2nd toe); repeated with shoes donned with same outcome.  - anterior heel tap off 4-6 inch step with BUE support 2x15 each side- walking lunges x 40 feet (stopped when increasing pain in R 2nd toe) - single leg sit to stand  x10 each side.  - heel raises x 18 (pain in 2nd toe with each ankle elevation) - side stepping with yellow theraband around ankles with focus on keeping subtalar neutral during weight bearing, 2x20 feet.       Cuing for improved form, modifications for improved comfort. Updated HEP  Manual therapy:to reduce pain and tissue tension, improve range of motion, neuromodulation, in order to promote improved ability to complete functional activities. Marcello Moores test: positive for rectus femoris tightness and psoas tightness bilaterally.  - supine hip flexor stretch in thomas test position with contract-relax technique, x 2 cycles each side. Tolerably uncomfortable.  - long sitting R ankle joint mobilizations grade II-IV to improve motion, subtalar lateral glide, talocrual posterior glide, subtalar and talocrual distraction. Manual PROM stretching into dorsiflexion with great and small toe extension.   HOME EXERCISE PROGRAM Access Code: [email or web address]  URL:  [email or web address]  Date: 06/03/2019  Prepared by: Rosita Kea   Exercises Arch Lifting - 2 sets - 15 reps - 3 seconds hold - 1-2x daily Forward Step Down Touch with Heel - 3 sets - 10 reps - 1x daily Single Leg Running Balance - 3 sets - 10 reps - 1x daily Heel Raise - 1-2 sets - 20 reps - 2-3x daily Single Leg Sit to Stand with Arms Extended - 3 sets - 10 reps - 1x daily Patient Education Scar Massage    PT Education - 06/03/19 1840    Education Details  Exercise purpose/form. Self management techniques.    Person(s) Educated  Patient    Methods  Explanation;Demonstration;Tactile cues;Verbal cues;Handout    Comprehension  Verbalized understanding;Returned demonstration;Verbal cues required;Tactile cues required;Need further instruction       PT Short Term Goals - 04/22/19 1458      PT SHORT TERM GOAL #1   Title  Be independent with initial home exercise program for self-management of symptoms.    Baseline  Initial HEP provided at IE (04/16/2019);    Time  2    Period  Weeks    Status  Achieved    Target Date  04/30/19        PT Long Term Goals - 05/21/19 1306      PT LONG TERM GOAL #1   Title  Be independent with a long-term home exercise program for self-management of symptoms    Baseline  Initial HEP provided at IE (04/16/2019); currently participating in appropriate HEP    Time  8    Period  Weeks    Status  Partially Met    Target Date  07/07/19      PT LONG TERM GOAL #2   Title  Patient will be able to return to driving and work (50/27/7412)    Baseline  Currently unable to drive or work (87/86/7672); has started driving short distances; has not yet returned to work but is in discussions with supervisor (05/12/2019); now driving, plans to return to work but not yet full capacity (05/20/2020);    Time  8    Period  Weeks    Status  Partially Met    Target Date  07/07/19      PT LONG TERM GOAL #3   Title  Demonstrate improved FOTO score by  10 units to demonstrate improvement in overall condition and self-reported functional ability.    Baseline  FOTO = 56 (04/16/2019); 65 (05/12/2019); 75 (05/21/2019);    Time  8    Period  Weeks    Status  Partially Met    Target Date  07/06/18      PT LONG TERM GOAL #4   Title  Patient will demonstrate R ankle and great toe A/PROM equal or greater than left ankle and great toe to improve ability to ambulate with normal gait pattern and return to PLOF running, stairs, walking.    Baseline  see objective data (04/16/2019); see objective data (05/12/2019);    Time  8    Period  Weeks    Status  Partially Met    Target Date  07/07/19      PT LONG TERM GOAL #5   Title  Patient will demonstrate equal or greater than 4+/5 strength in all directions of R ankle to improve ability to complete weight bearing activities safely.    Baseline  not tested on R due to surgical precautions (04/16/2019); see objective exam (05/12/2019);    Time  8    Period  Weeks    Status  Partially Met    Target Date  07/07/19      PT LONG TERM GOAL #6   Title  Complete community, work and/or recreational activities without limitation due to current condition.    Baseline  prolonged standing, showering, walking, cannot work, cannot drive, running, hiking, any activities requiring weight bearing of the R foot.(04/16/2019); improving with walking boot on but still limited (05/12/2019); now able to drive, walking in shoe with mild antalgic gait, working on returning to work with plans to go back but not full capacity next week (12/23/20200);    Time  8    Period  Weeks    Status  Partially Met    Target Date  07/07/19            Plan - 06/03/19 1843    Clinical Impression Statement  Patient tolerated treatment well and was able to progress to lunges and more heel taps off edge of step. Continues to feel pain in toes with activities that fulcrum  off the forefoot. Patient would benefit from continued management of  limiting condition by skilled physical therapist to address remaining impairments and functional limitations to work towards stated goals and return to PLOF or maximal functional independence.    Personal Factors and Comorbidities  Fitness;Past/Current Experience;Time since onset of injury/illness/exacerbation    Examination-Activity Limitations  Squat;Stairs;Lift;Locomotion Level;Stand;Caring for Others;Carry;Transfers   prolonged standing, showering, walking, cannot work, cannot drive, running, hiking, any activities requiring weight bearing of the R foot.   Examination-Participation Restrictions  Cleaning;Shop;Yard Work;Community Activity;Driving;Interpersonal Relationship   work   Stability/Clinical Decision Making  Stable/Uncomplicated    Rehab Potential  Excellent    PT Frequency  2x / week    PT Duration  8 weeks    PT Treatment/Interventions  ADLs/Self Care Home Management;Biofeedback;Cryotherapy;Electrical Stimulation;Moist Heat;Gait training;Stair training;Functional mobility training;Therapeutic activities;Therapeutic exercise;Balance training;Neuromuscular re-education;Patient/family education;Manual techniques;Dry needling;Taping;Passive range of motion;Scar mobilization;Spinal Manipulations;Joint Manipulations    PT Next Visit Plan  progress ROM, strengtheing, weight bearing, proprioception/motor control, scar mobility as tolerated; hip strengthening    PT Home Exercise Plan  Medbridge Access Code: [email or web address]    Consulted and Agree with Plan of Care  Patient       Patient will benefit from skilled therapeutic intervention in order to improve the following deficits and impairments:  Abnormal gait, Decreased balance, Decreased endurance, Decreased mobility, Decreased skin integrity, Difficulty walking, Hypomobility, Increased muscle spasms, Decreased knowledge of precautions, Decreased range of motion, Decreased scar mobility, Increased edema, Impaired perceived functional ability,  Improper body mechanics, Decreased activity tolerance, Decreased coordination, Decreased strength, Increased fascial restricitons, Impaired flexibility, Pain  Visit Diagnosis: Pain in right foot  Stiffness of right foot, not elsewhere classified  Stiffness of right ankle, not elsewhere classified  Muscle weakness (generalized)  Difficulty in walking, not elsewhere classified     Problem List There are no problems to display for this patient.   Everlean Alstrom. Graylon Good, PT, DPT 06/03/19, 6:44 PM   Addendum to correct manual therapy section.  Everlean Alstrom. Graylon Good, PT, DPT 06/09/19, 10:41 AM   [facility] [address] [place], [address] Phone: [phone]   Fax:  [phone]  Name: Brianna Odonnell MRN: [id number] Date of Birth: 01/29/1991

## 2019-06-09 ENCOUNTER — Encounter: Payer: Self-pay | Admitting: Physical Therapy

## 2019-06-09 ENCOUNTER — Ambulatory Visit: Payer: Commercial Managed Care - PPO | Admitting: Physical Therapy

## 2019-06-09 ENCOUNTER — Other Ambulatory Visit: Payer: Self-pay

## 2019-06-09 DIAGNOSIS — R262 Difficulty in walking, not elsewhere classified: Secondary | ICD-10-CM

## 2019-06-09 DIAGNOSIS — M25671 Stiffness of right ankle, not elsewhere classified: Secondary | ICD-10-CM

## 2019-06-09 DIAGNOSIS — M79671 Pain in right foot: Secondary | ICD-10-CM

## 2019-06-09 DIAGNOSIS — M25674 Stiffness of right foot, not elsewhere classified: Secondary | ICD-10-CM

## 2019-06-09 DIAGNOSIS — M6281 Muscle weakness (generalized): Secondary | ICD-10-CM

## 2019-06-09 NOTE — Therapy (Signed)
[facility] [address] [place], [address] Phone: [phone]   Fax:  [phone]  Physical Therapy Treatment  Patient Details  Name: Brianna Odonnell MRN: [id number] Date of Birth: Mar 25, 1991 Referring Provider (PT): [name], DPM   Encounter Date: 06/09/2019  PT End of Session - 06/09/19 1441    Visit Number  13    Number of Visits  23    Date for PT Re-Evaluation  07/07/19    Authorization Type  Reporting period from 04/16/2019    Authorization - Visit Number  3    Authorization - Number of Visits  10    PT Start Time  2449    PT Stop Time  1115    PT Time Calculation (min)  1420 min    Activity Tolerance  Patient tolerated treatment well    Behavior During Therapy  [facility] for tasks assessed/performed       Past Medical History:  Diagnosis Date  . Anxiety    situational  . Complication of anesthesia   . PONV (postoperative nausea and vomiting)     Past Surgical History:  Procedure Laterality Date  . MINOR HARDWARE REMOVAL Right 05/06/2019   Procedure: REMOVAL SCREW; DEEP;  Surgeon: [name], DPM;  Location: [place];  Service: Podiatry;  Laterality: Right;  UPREG, LATEX  . MULTIPLE TOOTH EXTRACTIONS  2009  . OPEN REDUCTION INTERNAL FIXATION (ORIF) FOOT LISFRANC FRACTURE Right 02/20/2019   Procedure: LISFRANC;EACH JOINT X 2 RIGHT;  Surgeon: [name], DPM;  Location: [place];  Service: Podiatry;  Laterality: Right;  GENERAL, POP/SAPH PREOP BLOCK UPREG  . TONSILLECTOMY    . UTERINE SEPTUM RESECTION  2019  . WISDOM TOOTH EXTRACTION      There were no vitals filed for this visit.  Subjective Assessment - 06/09/19 1101    Subjective  Patient reports she is feeling okay today. Just got off work and her R ankle/foot is a bit sore but she would not give it a numeric pain rating. Also continues to have some mild discomfort like a "catch" at the R hip. States she  conitnues to notice the pain in her lateral 3 rays and this seems odd to her since the surgery was at the second and first rays. State she has not been getting as much HEP in due to odd work schedule. Was a bit sore for a day at the anterior hips.    Pertinent History  Patient is a 29 y.o. female who presents to outpatient physical therapy with a referral for medical diagnosis closed displced fracture of second metatarsal bone of R foot; post op R lisfranc ORIF. This patient's chief complaints consist of decreased function, pain, and stiffness of R foot leading to the following functional deficits: prolonged standing, showering, walking, cannot work, cannot drive, running, hiking, any activities requiring weight bearing of the R foot. Relevant past medical history and comorbidities include DENIES: brain, lung, diabetes, heart problems. .    Limitations  Standing;Walking;House hold activities;Other (comment)   Functional limitations: prolonged standing, showering, walking, cannot work, cannot drive, running, hiking, any activities requiring weight bearing of the R foot.   How long can you sit comfortably?  no limitations    How long can you stand comfortably?  25 min    How long can you walk comfortably?  takes longer and cannot take dogs on a walk.    Diagnostic tests  screw placement  excellent  across medial cuneiform to second metatarsal.    Patient Stated Goals  She would like to go back to work and would like to be able to drive. Her doctor thinks it will be after the first of the year before she can go back to work.    Pain Score  1     Pain Onset  More than a month ago          TREATMENT: S/P ORIF for R lisfranc fracture 02/20/2019; hardware removal 05/06/2019. From Physician:"still ambulate in boot at all times until hardware is removed. Work on range of motonn, scar massage/loosening, strengthening. Once hardware is removed, then work outside of the boot on strength/ROM/gait  training/proprioception."  Therapeutic exercise:to centralize symptoms and improve ROM, strength, muscular endurance, and activity tolerance required for successful completion of functional activities. -Treadmill 2.1 mph 0% grade. For improved lower extremity mobility, muscular endurance, and weightbearing activity tolerance; and to induce the analgesic effect of aerobic exercise, stimulate improved joint nutrition, and prepare body structures and systems for following interventions. X 6.5  Minutes during subjective exam.   (manual therapy - see below)  - monster walk forward diagonals with green theraband around distal thighs, pausing at each step to get balance. 2x40 feet with instructions to get lower  - single leg sit to stand  x10 each side. (required touch down assist from left.) - SLS with pass arounds using 20# kettlebell, 2x10 each leg for improved proprioception. - anterior heel tap off 4(L heel)-6 (R heel) inch step with touchdown BUE support, 2x10 each side.  - side stepping with red theraband around ankles with focus on keeping subtalar neutral during weight bearing, 2x20 feet each way.   Cuing for improved form, modifications for improved comfort. Updated HEP  Manual therapy:to reduce pain and tissue tension, improve range of motion, neuromodulation, in order to promote improved ability to complete functional activities. - long sitting R ankle joint mobilizations grade II-IV to improve motion, subtalar lateral glide, talocrual posterior glide, subtalar and talocrual distraction.Manual PROM stretching into dorsiflexion with great and small toe extension.  - joint play assessment at lateral metatarsals and tarsals - tender and reproduce concordant pain. Instructed patient to ask physician about this at next session to make sure he knows she is having discomfort here.   HOME EXERCISE PROGRAM Access Code: [email or web address]      URL: [email or web address]    Date:  06/03/2019  Prepared by: Rosita Kea   Exercises Arch Lifting - 2 sets - 15 reps - 3 seconds hold - 1-2x daily Forward Step Down Touch with Heel - 3 sets - 10 reps - 1x daily Single Leg Running Balance - 3 sets - 10 reps - 1x daily Heel Raise - 1-2 sets - 20 reps - 2-3x daily Single Leg Sit to Stand with Arms Extended - 3 sets - 10 reps - 1x daily Patient Education Scar Massage    PT Education - 06/09/19 1441    Education Details  Exercise purpose/form. Self management techniques.    Person(s) Educated  Patient    Methods  Explanation;Demonstration;Tactile cues;Verbal cues    Comprehension  Verbalized understanding;Returned demonstration;Verbal cues required;Tactile cues required;Need further instruction       PT Short Term Goals - 04/22/19 1458      PT SHORT TERM GOAL #1   Title  Be independent with initial home exercise program for self-management of symptoms.    Baseline  Initial HEP provided at IE (04/16/2019);    Time  2    Period  Weeks    Status  Achieved    Target Date  04/30/19        PT Long Term Goals - 05/21/19 1306      PT LONG TERM GOAL #1   Title  Be independent with a long-term home exercise program for self-management of symptoms    Baseline  Initial HEP provided at IE (04/16/2019); currently participating in appropriate HEP    Time  8    Period  Weeks    Status  Partially Met    Target Date  07/07/19      PT LONG TERM GOAL #2   Title  Patient will be able to return to driving and work (80/99/8338)    Baseline  Currently unable to drive or work (25/09/3974); has started driving short distances; has not yet returned to work but is in discussions with supervisor (05/12/2019); now driving, plans to return to work but not yet full capacity (05/20/2020);    Time  8    Period  Weeks    Status  Partially Met    Target Date  07/07/19      PT LONG TERM GOAL #3   Title  Demonstrate improved FOTO score by 10 units to demonstrate improvement in overall  condition and self-reported functional ability.    Baseline  FOTO = 56 (04/16/2019); 65 (05/12/2019); 75 (05/21/2019);    Time  8    Period  Weeks    Status  Partially Met    Target Date  07/06/18      PT LONG TERM GOAL #4   Title  Patient will demonstrate R ankle and great toe A/PROM equal or greater than left ankle and great toe to improve ability to ambulate with normal gait pattern and return to PLOF running, stairs, walking.    Baseline  see objective data (04/16/2019); see objective data (05/12/2019);    Time  8    Period  Weeks    Status  Partially Met    Target Date  07/07/19      PT LONG TERM GOAL #5   Title  Patient will demonstrate equal or greater than 4+/5 strength in all directions of R ankle to improve ability to complete weight bearing activities safely.    Baseline  not tested on R due to surgical precautions (04/16/2019); see objective exam (05/12/2019);    Time  8    Period  Weeks    Status  Partially Met    Target Date  07/07/19      PT LONG TERM GOAL #6   Title  Complete community, work and/or recreational activities without limitation due to current condition.    Baseline  prolonged standing, showering, walking, cannot work, cannot drive, running, hiking, any activities requiring weight bearing of the R foot.(04/16/2019); improving with walking boot on but still limited (05/12/2019); now able to drive, walking in shoe with mild antalgic gait, working on returning to work with plans to go back but not full capacity next week (12/23/20200);    Time  8    Period  Weeks    Status  Partially Met    Target Date  07/07/19            Plan - 06/09/19 1114    Clinical Impression Statement  Patient tolerated treatment well overall with some discomfort mostly felt in the lateral toes during activities. Focused on improved mobility, proprioception and foot/ankle strength/endurance. Patient continues to demonstrate decreased  proprioceptive response, decreased balance,  weakness, and decreased muscular endurance and coordination on R foot/ankle compared to L. Appears to be tolerating graded return to work well. Patient would benefit from continued management of limiting condition by skilled physical therapist to address remaining impairments and functional limitations to work towards stated goals and return to PLOF or maximal functional independence.    Personal Factors and Comorbidities  Fitness;Past/Current Experience;Time since onset of injury/illness/exacerbation    Examination-Activity Limitations  Squat;Stairs;Lift;Locomotion Level;Stand;Caring for Others;Carry;Transfers   prolonged standing, showering, walking, cannot work, cannot drive, running, hiking, any activities requiring weight bearing of the R foot.   Examination-Participation Restrictions  Cleaning;Shop;Yard Work;Community Activity;Driving;Interpersonal Relationship   work   Stability/Clinical Decision Making  Stable/Uncomplicated    Rehab Potential  Excellent    PT Frequency  2x / week    PT Duration  8 weeks    PT Treatment/Interventions  ADLs/Self Care Home Management;Biofeedback;Cryotherapy;Electrical Stimulation;Moist Heat;Gait training;Stair training;Functional mobility training;Therapeutic activities;Therapeutic exercise;Balance training;Neuromuscular re-education;Patient/family education;Manual techniques;Dry needling;Taping;Passive range of motion;Scar mobilization;Spinal Manipulations;Joint Manipulations    PT Next Visit Plan  progress ROM, strengtheing, weight bearing, proprioception/motor control, scar mobility as tolerated; hip strengthening    PT Home Exercise Plan  Medbridge Access Code: [email or web address]    Consulted and Agree with Plan of Care  Patient       Patient will benefit from skilled therapeutic intervention in order to improve the following deficits and impairments:  Abnormal gait, Decreased balance, Decreased endurance, Decreased mobility, Decreased skin integrity, Difficulty  walking, Hypomobility, Increased muscle spasms, Decreased knowledge of precautions, Decreased range of motion, Decreased scar mobility, Increased edema, Impaired perceived functional ability, Improper body mechanics, Decreased activity tolerance, Decreased coordination, Decreased strength, Increased fascial restricitons, Impaired flexibility, Pain  Visit Diagnosis: Pain in right foot  Stiffness of right foot, not elsewhere classified  Stiffness of right ankle, not elsewhere classified  Muscle weakness (generalized)  Difficulty in walking, not elsewhere classified     Problem List There are no problems to display for this patient.   Everlean Alstrom. Graylon Good, PT, DPT 06/09/19, 2:43 PM  [facility] [address] [place], [address] Phone: [phone]   Fax:  [phone]  Name: Brianna Odonnell MRN: [id number] Date of Birth: 01-12-1991

## 2019-06-11 ENCOUNTER — Encounter: Payer: Commercial Managed Care - PPO | Admitting: Physical Therapy

## 2019-06-18 ENCOUNTER — Ambulatory Visit: Payer: Commercial Managed Care - PPO | Admitting: Physical Therapy

## 2019-06-18 ENCOUNTER — Other Ambulatory Visit: Payer: Self-pay

## 2019-06-18 DIAGNOSIS — R262 Difficulty in walking, not elsewhere classified: Secondary | ICD-10-CM

## 2019-06-18 DIAGNOSIS — M6281 Muscle weakness (generalized): Secondary | ICD-10-CM

## 2019-06-18 DIAGNOSIS — M25674 Stiffness of right foot, not elsewhere classified: Secondary | ICD-10-CM

## 2019-06-18 DIAGNOSIS — M79671 Pain in right foot: Secondary | ICD-10-CM | POA: Diagnosis not present

## 2019-06-18 DIAGNOSIS — M25671 Stiffness of right ankle, not elsewhere classified: Secondary | ICD-10-CM

## 2019-06-18 NOTE — Therapy (Signed)
[facility] [address] [place], [address] Phone: [phone]   Fax:  [phone]  Physical Therapy Treatment  Patient Details  Name: Brianna Odonnell MRN: [id number] Date of Birth: 1991/05/17 Referring Provider (PT): [name], DPM   Encounter Date: 06/18/2019  PT End of Session - 06/18/19 1836    Visit Number  14    Number of Visits  23    Date for PT Re-Evaluation  07/07/19    Authorization Type  Reporting period from 04/16/2019    Authorization - Visit Number  4    Authorization - Number of Visits  10    PT Start Time  4982    PT Stop Time  1804    PT Time Calculation (min)  49 min    Activity Tolerance  Patient tolerated treatment well    Behavior During Therapy  [facility] for tasks assessed/performed       Past Medical History:  Diagnosis Date  . Anxiety    situational  . Complication of anesthesia   . PONV (postoperative nausea and vomiting)     Past Surgical History:  Procedure Laterality Date  . MINOR HARDWARE REMOVAL Right 05/06/2019   Procedure: REMOVAL SCREW; DEEP;  Surgeon: [name], DPM;  Location: [facility];  Service: Podiatry;  Laterality: Right;  UPREG, LATEX  . MULTIPLE TOOTH EXTRACTIONS  2009  . OPEN REDUCTION INTERNAL FIXATION (ORIF) FOOT LISFRANC FRACTURE Right 02/20/2019   Procedure: LISFRANC;EACH JOINT X 2 RIGHT;  Surgeon: [name], DPM;  Location: [place];  Service: Podiatry;  Laterality: Right;  GENERAL, POP/SAPH PREOP BLOCK UPREG  . TONSILLECTOMY    . UTERINE SEPTUM RESECTION  2019  . WISDOM TOOTH EXTRACTION      There were no vitals filed for this visit.  Subjective Assessment - 06/18/19 1716    Subjective  Patient repors she is feeling a little sore today after working 3 shifts. s/he is also going in for a half day tonight. She spoke to her doctor about the toes hurting and he felt it was from being in an abnormal position for so long  and it just needs to work out.. 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 HEP is going pretty good. she still has a littel trouble going down on the stair and going up stairs a little bit, mostly because she is going up/down quickly to keep pace with freinds. She was a little bit sore following last treatment session, but not more than usual.    Pertinent History  Patient is a 29 y.o. female who presents to outpatient physical therapy with a referral for medical diagnosis closed displced fracture of second metatarsal bone of R foot; post op R lisfranc ORIF. This patient's chief complaints consist of decreased function, pain, and stiffness of R foot leading to the following functional deficits: prolonged standing, showering, walking, cannot work, cannot drive, running, hiking, any activities requiring weight bearing of the R foot. Relevant past medical history and comorbidities include DENIES: brain, lung, diabetes, heart problems. .    Limitations  Standing;Walking;House hold activities;Other (comment)   Functional limitations: prolonged standing, showering, walking, cannot work, cannot drive, running, hiking, any activities requiring weight bearing of the R foot.   How long can you sit comfortably?  no limitations    How long can you stand comfortably?  25 min    How long  can you walk comfortably?  takes longer and cannot take dogs on a walk.    Diagnostic tests  screw placement  excellent across medial cuneiform to second metatarsal.    Patient Stated Goals  She would like to go back to work and would like to be able to drive. Her doctor thinks it will be after the first of the year before she can go back to work.    Currently in Pain?  No/denies    Pain Onset  More than a month ago       TREATMENT: S/P ORIF for R lisfranc fracture 02/20/2019; hardware removal 05/06/2019. From Physician:"still ambulate in boot at all times until hardware is removed.  Work on range of motonn, scar massage/loosening, strengthening. Once hardware is removed, then work outside of the boot on strength/ROM/gait training/proprioception."  Therapeutic exercise:to centralize symptoms and improve ROM, strength, muscular endurance, and activity tolerance required for successful completion of functional activities. - monster walk forward diagonals with green theraband around distal thighs, pausing at each step to get balance. 2x40, 2x40 each direction with medicine ball twist at each step - heel/toe raises on edge of airex with BUE support x 20 each direction - step up/down to 12 inch step using bottom foot to spring and absorb shock slightly, 2x10 each side.  - mini lung to BOSU (dome up), 2x10 each side.  - running man x 10 balancing on R foot. Then with airex pad 3x10 each side. No UE support. Extra time to find balance.    (manual therapy - see below)  Cuing for improved form, monitoring for acceptable comfort.  Manual therapy:to reduce pain and tissue tension, improve range of motion, neuromodulation, in order to promote improved ability to complete functional activities. - long sitting R ankle joint mobilizations grade II-IV to improve motion, subtalar lateral glide, talocrual posterior glide, subtalar and talocrual distraction. - mobilization of all lateral metatarsals and tarsals, with specific attention to cuboid to improve motion and pain. Well tolerated but slightly tender at tarsals.    HOME EXERCISE PROGRAM Access Code: [email or web address] URL: [email or web address] Date: 06/03/2019  Prepared by: Rosita Kea   Exercises Arch Lifting - 2 sets - 15 reps - 3 seconds hold - 1-2x daily Forward Step Down Touch with Heel - 3 sets - 10 reps - 1x daily Single Leg Running Balance - 3 sets - 10 reps - 1x daily Heel Raise - 1-2 sets - 20 reps - 2-3x daily Single Leg Sit to Stand with Arms Extended - 3 sets - 10 reps - 1x daily Patient  Education Scar Massage   PT Education - 06/18/19 1835    Education Details  Exercise purpose/form. Self management techniques.    Person(s) Educated  Patient    Methods  Explanation;Demonstration;Verbal cues    Comprehension  Verbalized understanding;Returned demonstration;Verbal cues required;Need further instruction       PT Short Term Goals - 04/22/19 1458      PT SHORT TERM GOAL #1   Title  Be independent with initial home exercise program for self-management of symptoms.    Baseline  Initial HEP provided at IE (04/16/2019);    Time  2    Period  Weeks    Status  Achieved    Target Date  04/30/19        PT Long Term Goals - 05/21/19 1306      PT LONG TERM GOAL #1   Title  Be independent with a long-term home exercise program for  self-management of symptoms    Baseline  Initial HEP provided at IE (04/16/2019); currently participating in appropriate HEP    Time  8    Period  Weeks    Status  Partially Met    Target Date  07/07/19      PT LONG TERM GOAL #2   Title  Patient will be able to return to driving and work (35/70/1779)    Baseline  Currently unable to drive or work (39/07/90); has started driving short distances; has not yet returned to work but is in discussions with supervisor (05/12/2019); now driving, plans to return to work but not yet full capacity (05/20/2020);    Time  8    Period  Weeks    Status  Partially Met    Target Date  07/07/19      PT LONG TERM GOAL #3   Title  Demonstrate improved FOTO score by 10 units to demonstrate improvement in overall condition and self-reported functional ability.    Baseline  FOTO = 56 (04/16/2019); 65 (05/12/2019); 75 (05/21/2019);    Time  8    Period  Weeks    Status  Partially Met    Target Date  07/06/18      PT LONG TERM GOAL #4   Title  Patient will demonstrate R ankle and great toe A/PROM equal or greater than left ankle and great toe to improve ability to ambulate with normal gait pattern and return  to PLOF running, stairs, walking.    Baseline  see objective data (04/16/2019); see objective data (05/12/2019);    Time  8    Period  Weeks    Status  Partially Met    Target Date  07/07/19      PT LONG TERM GOAL #5   Title  Patient will demonstrate equal or greater than 4+/5 strength in all directions of R ankle to improve ability to complete weight bearing activities safely.    Baseline  not tested on R due to surgical precautions (04/16/2019); see objective exam (05/12/2019);    Time  8    Period  Weeks    Status  Partially Met    Target Date  07/07/19      PT LONG TERM GOAL #6   Title  Complete community, work and/or recreational activities without limitation due to current condition.    Baseline  prolonged standing, showering, walking, cannot work, cannot drive, running, hiking, any activities requiring weight bearing of the R foot.(04/16/2019); improving with walking boot on but still limited (05/12/2019); now able to drive, walking in shoe with mild antalgic gait, working on returning to work with plans to go back but not full capacity next week (12/23/20200);    Time  8    Period  Weeks    Status  Partially Met    Target Date  07/07/19            Plan - 06/18/19 1834    Clinical Impression Statement  Patient tolerated treatment well and continues to progress towards goals. Noted for significant improvement in single leg balance and was able to progress to more power-based movements to work towards running. Continues to have quicker fatigue on R than L and discomfort and has not returned to jumping or running, which she uses at work for emergency situations. Patient would benefit from continued management of limiting condition by skilled physical therapist to address remaining impairments and functional limitations to work towards stated goals and return to  PLOF or maximal functional independence.    Personal Factors and Comorbidities  Fitness;Past/Current Experience;Time  since onset of injury/illness/exacerbation    Examination-Activity Limitations  Squat;Stairs;Lift;Locomotion Level;Stand;Caring for Others;Carry;Transfers   prolonged standing, showering, walking, cannot work, cannot drive, running, hiking, any activities requiring weight bearing of the R foot.   Examination-Participation Restrictions  Cleaning;Shop;Yard Work;Community Activity;Driving;Interpersonal Relationship   work   Stability/Clinical Decision Making  Stable/Uncomplicated    Rehab Potential  Excellent    PT Frequency  2x / week    PT Duration  8 weeks    PT Treatment/Interventions  ADLs/Self Care Home Management;Biofeedback;Cryotherapy;Electrical Stimulation;Moist Heat;Gait training;Stair training;Functional mobility training;Therapeutic activities;Therapeutic exercise;Balance training;Neuromuscular re-education;Patient/family education;Manual techniques;Dry needling;Taping;Passive range of motion;Scar mobilization;Spinal Manipulations;Joint Manipulations    PT Next Visit Plan  progress ROM, strengtheing, weight bearing, proprioception/motor control, scar mobility as tolerated; hip strengthening    PT Home Exercise Plan  Medbridge Access Code: [email or web address]    Consulted and Agree with Plan of Care  Patient       Patient will benefit from skilled therapeutic intervention in order to improve the following deficits and impairments:  Abnormal gait, Decreased balance, Decreased endurance, Decreased mobility, Decreased skin integrity, Difficulty walking, Hypomobility, Increased muscle spasms, Decreased knowledge of precautions, Decreased range of motion, Decreased scar mobility, Increased edema, Impaired perceived functional ability, Improper body mechanics, Decreased activity tolerance, Decreased coordination, Decreased strength, Increased fascial restricitons, Impaired flexibility, Pain  Visit Diagnosis: Pain in right foot  Stiffness of right foot, not elsewhere classified  Stiffness of right  ankle, not elsewhere classified  Muscle weakness (generalized)  Difficulty in walking, not elsewhere classified     Problem List There are no problems to display for this patient.   Everlean Alstrom. Graylon Good, PT, DPT 06/18/19, 6:39 PM  [facility] [address], [place], [address] Phone: [phone]   Fax:  [phone]  Name: SHANAYA SCHNECK MRN: [id number] Date of Birth: [birth year]

## 2019-06-19 ENCOUNTER — Ambulatory Visit: Payer: Commercial Managed Care - PPO | Admitting: Physical Therapy

## 2019-06-19 ENCOUNTER — Encounter: Payer: Self-pay | Admitting: Physical Therapy

## 2019-06-19 DIAGNOSIS — M79671 Pain in right foot: Secondary | ICD-10-CM | POA: Diagnosis not present

## 2019-06-19 DIAGNOSIS — M25671 Stiffness of right ankle, not elsewhere classified: Secondary | ICD-10-CM

## 2019-06-19 DIAGNOSIS — M25674 Stiffness of right foot, not elsewhere classified: Secondary | ICD-10-CM

## 2019-06-19 DIAGNOSIS — R262 Difficulty in walking, not elsewhere classified: Secondary | ICD-10-CM

## 2019-06-19 DIAGNOSIS — M6281 Muscle weakness (generalized): Secondary | ICD-10-CM

## 2019-06-19 NOTE — Therapy (Signed)
[facility] [address] [place], [address] Phone: [phone]   Fax:  [phone]  Physical Therapy Treatment / Progress Note Reporting Period: 05/21/2019 - 06/19/2019  Patient Details  Name: Brianna Odonnell MRN: [id number] Date of Birth: 1991-04-16 Referring Provider (PT): [name], DPM   Encounter Date: 06/19/2019  PT End of Session - 06/19/19 1922    Visit Number  15    Number of Visits  23    Date for PT Re-Evaluation  07/07/19    Authorization Type  Reporting period from 04/16/2019    Authorization - Visit Number  5    Authorization - Number of Visits  10    PT Start Time  1300    PT Stop Time  1343    PT Time Calculation (min)  43 min    Activity Tolerance  Patient tolerated treatment well    Behavior During Therapy  [facility] for tasks assessed/performed       Past Medical History:  Diagnosis Date  . Anxiety    situational  . Complication of anesthesia   . PONV (postoperative nausea and vomiting)     Past Surgical History:  Procedure Laterality Date  . MINOR HARDWARE REMOVAL Right 05/06/2019   Procedure: REMOVAL SCREW; DEEP;  Surgeon: [name], DPM;  Location: [place];  Service: Podiatry;  Laterality: Right;  UPREG, LATEX  . MULTIPLE TOOTH EXTRACTIONS  2009  . OPEN REDUCTION INTERNAL FIXATION (ORIF) FOOT LISFRANC FRACTURE Right 02/20/2019   Procedure: LISFRANC;EACH JOINT X 2 RIGHT;  Surgeon: [name], DPM;  Location: [place];  Service: Podiatry;  Laterality: Right;  GENERAL, POP/SAPH PREOP BLOCK UPREG  . TONSILLECTOMY    . UTERINE SEPTUM RESECTION  2019  . WISDOM TOOTH EXTRACTION      There were no vitals filed for this visit.  Subjective Assessment - 06/19/19 1911    Subjective  Patient reports her ankle/foot is sore today after working 3.5 shifts consecutively and having PT yesterday. She states she feels like her condition will continue to get better  with time, continued gradual return to usual activities, and continuing HEP. She feels her balance has improved a lot. She agrees she would like to work on HEP independently for a while and check in with physical therapy in a month or if she feels like she needs more support before then.    Pertinent History  Patient is a 29 y.o. female who presents to outpatient physical therapy with a referral for medical diagnosis closed displced fracture of second metatarsal bone of R foot; post op R lisfranc ORIF. This patient's chief complaints consist of decreased function, pain, and stiffness of R foot leading to the following functional deficits: prolonged standing, showering, walking, cannot work, cannot drive, running, hiking, any activities requiring weight bearing of the R foot. Relevant past medical history and comorbidities include DENIES: brain, lung, diabetes, heart problems. .    Limitations  Standing;Walking;House hold activities;Other (comment)   Functional limitations: prolonged standing, showering, walking, cannot work, cannot drive, running, hiking, any activities requiring weight bearing of the R foot.   How long can you sit comfortably?  no limitations    How long can you stand comfortably?  25 min    How long can you walk comfortably?  takes longer and cannot take dogs on a walk.    Diagnostic tests  screw placement  excellent across medial cuneiform to second metatarsal.  Patient Stated Goals  She would like to go back to work and would like to be able to drive. Her doctor thinks it will be after the first of the year before she can go back to work.    Currently in Pain?  Yes    Pain Score  2     Pain Location  Foot    Pain Orientation  Right    Pain Descriptors / Indicators  Aching    Pain Onset  More than a month ago        [facility] PT Assessment - 06/19/19 0001      Assessment   Medical Diagnosis  Closed displced fracture of second metatarsal bone of R foot; post op R lisfranc ORIF     Referring Provider (PT)  [name], DPM    Onset Date/Surgical Date  02/20/19   fractured 01/30/2019   Next MD Visit  --    Prior Therapy  not for this problem prior to this episode of care      Precautions   Precautions  None   no weight bearing out of the [place] residence    Living Arrangements  Parent;Other relatives    Home Access  --   no concerns at this point getting around her home     Prior Function   Level of Independence  Independent    Vocation  Full time employment    Vocation Requirements  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 12 hour shifts 3x a week. She works in     Leisure  traveling, going to the [place], read a lot, hang out with freinds and boyfreind, play with dogs      Cognition   Overall Cognitive Status  Within Functional Limits for tasks assessed      Observation/Other Assessments   Observations  see note from 06/19/2019 for latest objective exam    Focus on Therapeutic Outcomes ([place])   FOTO = 73        OBJECTIVE  OBSERVATION/INSPECTION  Tremor: none  Muscle bulk: generallyWFL bilaterally  Skin:The incision sites appear to be healing well with no excessive redness, warmth, drainage or signs of infection present.   Gait: grossly WFL for household and short community ambulationwith and without shoe.Demonstrates decreased R tibial translation as well as decreased push off with faster speeds.   PERIPHERAL JOINT MOTION (in degrees) *Indicates pain, R/L Date    Joint/Motion AROM PROM Comments  Ankle/Foot     Dorsiflexion (knee ext) 5/0 13/10   Dorsiflexion (knee flex) 10/10 15/10   Plantarflexion 72/76 /   Everison 23/35 /   Inversion 50/50 /   Great toe ext / 100/110 metatarsals to phalanges                                       Toe to wall dorsiflexion test: L = 4.25 inches; R 4.25 inches.   MUSCLE  PERFORMANCE (MMT): B hips, knees, equal and WFL L ankle 5/5 all directions R ankle  PF = 4/5 (limited in single leg)  DF = 4+/5  Inversion = 4+/5*  Eversion = 4+/5  Pronation = 4/5*  ACCESSORY MOTION:  WFL  FUNCTIONAL MOBILITY/TESTING:  Bed mobility:WFL  Transfers:WFL with no shoe  SLS EO:  L = > 30 seconds; R = >30 seconds (increased sway)  SLS EC: L = 28 seconds (increased sway); R = 14 seconds (lots of movement but no contact between legs).   EDUCATION/COGNITION:  Patient is alert and oriented X 4.  Objective measurements completed on examination: See above findings.   TREATMENT: S/P ORIF for R lisfranc fracture 02/20/2019; hardware removal 05/06/2019. From Physician:"still ambulate in boot at all times until hardware is removed. Work on range of motonn, scar massage/loosening, strengthening. Once hardware is removed, then work outside of the boot on strength/ROM/gait training/proprioception."  Therapeutic exercise:to centralize symptoms and improve ROM, strength, muscular endurance, and activity tolerance required for successful completion of functional activities. - monster walk forward diagonals with green theraband around distal thighs, pausing at eachstep to get balance. 2x40 each direction with medicine ball twist at each step  - running man with airex pad 3x10 each side. No UE support. Extra time to find balance.   - reverse step ups on 6 inch step with emphasis on pushing off and eccentric control of bottom foot. X 10 L foot up first, x 20 R foot up first. Feels pinching in  - eccentric R SL heel lower on floor with BUE support x 10 (painful so did not repeat) - measurements to assess progress - mini lung to BOSU (dome up), 2x10 each side.  - Education on HEP including handout  - Education on diagnosis, prognosis, POC, anatomy and physiology of current condition.   Cuing for improved form, monitoring for acceptable comfort.   HOME  EXERCISE PROGRAM Access Code: [email or web address]  URL: [email or web address]  Date: 06/19/2019  Prepared by: Rosita Kea   Exercises Forward Step Down Touch with Heel - 3 sets - 10 reps - 1x daily Single Leg Running Balance - 2 sets - 15 reps - 1x daily Standing Toe Raises at Chair - 1-2 sets - 20 reps - 2-3x daily Standing Eccentric Heel Raise - 2-3 sets - 10 reps - 2-3x daily Single Leg Sit to Stand with Arms Extended - 3 sets - 10 reps - 1x daily Seated Toe Flexion Extension PROM - 3 reps - 30 seconds hold - 2x daily    PT Education - 06/19/19 1922    Education Details  Exercise purpose/form. Self management techniques. Education on diagnosis, prognosis, POC, anatomy and physiology of current condition. HEP    Person(s) Educated  Patient    Methods  Explanation;Demonstration;Tactile cues;Verbal cues;Handout    Comprehension  Verbalized understanding;Returned demonstration;Verbal cues required;Tactile cues required       PT Short Term Goals - 06/19/19 1923      PT SHORT TERM GOAL #1   Title  Be independent with initial home exercise program for self-management of symptoms.    Baseline  Initial HEP provided at IE (04/16/2019);    Time  2    Period  Weeks    Status  Achieved    Target Date  04/30/19        PT Long Term Goals - 06/19/19 1923      PT LONG TERM GOAL #1   Title  Be independent with a long-term home exercise program for self-management of symptoms    Baseline  Initial HEP provided at IE (04/16/2019); currently participating in appropriate HEP    Time  8    Period  Weeks    Status  Achieved    Target Date  07/07/19      PT LONG TERM GOAL #2   Title  Patient will be able to return to driving and work (38/38/1840)    Baseline  Currently unable to drive or work (37/54/3606); has started driving short distances; has not yet returned to work but is in discussions with supervisor (05/12/2019); now driving, plans to return to work but not yet full capacity  (05/20/2020); patient has returned to work and has some pain with multiple days in a row (06/19/2019);    Time  8    Period  Weeks    Status  Achieved    Target Date  07/07/19      PT LONG TERM GOAL #3   Title  Demonstrate improved FOTO score by 10 units to demonstrate improvement in overall condition and self-reported functional ability.    Baseline  FOTO = 56 (04/16/2019); 65 (05/12/2019); 75 (05/21/2019); 85 (06/19/2019);    Time  8    Period  Weeks    Status  Achieved    Target Date  07/07/19      PT LONG TERM GOAL #4   Title  Patient will demonstrate R ankle and great toe A/PROM equal or greater than left ankle and great toe to improve ability to ambulate with normal gait pattern and return to PLOF running, stairs, walking.    Baseline  see objective data (04/16/2019); see objective data (05/12/2019); nearly met - see objective data (06/19/2019);    Time  8    Period  Weeks    Status  Partially Met    Target Date  07/07/19      PT LONG TERM GOAL #5   Title  Patient will demonstrate equal or greater than 4+/5 strength in all directions of R ankle to improve ability to complete weight bearing activities safely.    Baseline  not tested on R due to surgical precautions (04/16/2019); see objective exam (05/12/2019); nearly met - see objective data (06/19/2019);    Time  8    Period  Weeks    Status  Partially Met    Target Date  07/07/19      PT LONG TERM GOAL #6   Title  Complete community, work and/or recreational activities without limitation due to current condition.    Baseline  prolonged standing, showering, walking, cannot work, cannot drive, running, hiking, any activities requiring weight bearing of the R foot.(04/16/2019); improving with walking boot on but still limited (05/12/2019); now able to drive, walking in shoe with mild antalgic gait, working on returning to work with plans to go back but not full capacity next week (12/23/20200); can do everything except running,  jumping, back to back work shifts over 2 nights in a row bother her (06/19/2019);    Time  8    Period  Weeks    Status  Partially Met    Target Date  07/07/19            Plan - 06/19/19 1928    Clinical Impression Statement  Patient has attended 15 physical therapy sessions this episode of care and has made great progress towards goals, meeting or nearly meeting all of them. Subjectively, she reports being able to do all of her usual activities except running, jumping, going up and down stairs very quickly, and she continues to have some pain if she works more than 2 nights in a row. Her FOTO score has improved from 56 to 85, demonstrating a significant improvement in self-reported function. Objectively, she has improved in strength, balance, ROM, and activity tolerance. Patient has some  remaining impairments but has mostly returned to her usual activities and feels confident working on her HEP independently for a few weeks with the plan to check in with PT at that time and discharge if doing well, or return earlier if her progress stalls or she needs further care. Patient would benefit from continued management of limiting condition by skilled physical therapist to address remaining impairments and functional limitations to work towards stated goals and return to PLOF or maximal functional independence.    Personal Factors and Comorbidities  Fitness;Past/Current Experience;Time since onset of injury/illness/exacerbation    Examination-Activity Limitations  Squat;Stairs;Lift;Locomotion Level;Stand;Caring for Others;Carry;Transfers   prolonged standing, showering, walking, cannot work, cannot drive, running, hiking, any activities requiring weight bearing of the R foot.   Examination-Participation Restrictions  Cleaning;Shop;Yard Work;Community Activity;Driving;Interpersonal Relationship   work   Stability/Clinical Decision Making  Stable/Uncomplicated    Rehab Potential  Excellent    PT  Frequency  2x / week    PT Duration  8 weeks    PT Treatment/Interventions  ADLs/Self Care Home Management;Biofeedback;Cryotherapy;Electrical Stimulation;Moist Heat;Gait training;Stair training;Functional mobility training;Therapeutic activities;Therapeutic exercise;Balance training;Neuromuscular re-education;Patient/family education;Manual techniques;Dry needling;Taping;Passive range of motion;Scar mobilization;Spinal Manipulations;Joint Manipulations    PT Next Visit Plan  plan to check in with PT at that time and discharge if doing well, or return earlier if her progress stalls or she needs further care    PT Home Exercise Plan  Medbridge Access Code: [email or web address]    Consulted and Agree with Plan of Care  Patient       Patient will benefit from skilled therapeutic intervention in order to improve the following deficits and impairments:  Abnormal gait, Decreased balance, Decreased endurance, Decreased mobility, Decreased skin integrity, Difficulty walking, Hypomobility, Increased muscle spasms, Decreased knowledge of precautions, Decreased range of motion, Decreased scar mobility, Increased edema, Impaired perceived functional ability, Improper body mechanics, Decreased activity tolerance, Decreased coordination, Decreased strength, Increased fascial restricitons, Impaired flexibility, Pain  Visit Diagnosis: Pain in right foot  Stiffness of right foot, not elsewhere classified  Stiffness of right ankle, not elsewhere classified  Muscle weakness (generalized)  Difficulty in walking, not elsewhere classified     Problem List There are no problems to display for this patient.   Everlean Alstrom. Graylon Good, PT, DPT 06/19/19, 7:29 PM  [facility] [address] [place], [address] Phone: [phone]   Fax:  [phone]  Name: Brianna Odonnell MRN: [id number] Date of Birth: 1990-10-16

## 2019-06-24 ENCOUNTER — Ambulatory Visit: Payer: Commercial Managed Care - PPO | Admitting: Physical Therapy

## 2019-06-26 ENCOUNTER — Encounter: Payer: Commercial Managed Care - PPO | Admitting: Physical Therapy

## 2019-07-02 ENCOUNTER — Encounter: Payer: Commercial Managed Care - PPO | Admitting: Physical Therapy

## 2019-07-03 ENCOUNTER — Encounter: Payer: Commercial Managed Care - PPO | Admitting: Physical Therapy

## 2019-07-07 ENCOUNTER — Encounter: Payer: Commercial Managed Care - PPO | Admitting: Physical Therapy

## 2019-07-10 ENCOUNTER — Encounter: Payer: Commercial Managed Care - PPO | Admitting: Physical Therapy

## 2019-08-20 ENCOUNTER — Encounter: Payer: Self-pay | Admitting: Physical Therapy

## 2019-08-20 DIAGNOSIS — M79671 Pain in right foot: Secondary | ICD-10-CM

## 2019-08-20 DIAGNOSIS — M6281 Muscle weakness (generalized): Secondary | ICD-10-CM

## 2019-08-20 DIAGNOSIS — M25671 Stiffness of right ankle, not elsewhere classified: Secondary | ICD-10-CM

## 2019-08-20 DIAGNOSIS — M25674 Stiffness of right foot, not elsewhere classified: Secondary | ICD-10-CM

## 2019-08-20 DIAGNOSIS — R262 Difficulty in walking, not elsewhere classified: Secondary | ICD-10-CM

## 2019-08-20 NOTE — Therapy (Signed)
[facility] [address], [place], [address] Phone: [phone]   Fax:  [phone]  Physical Therapy No-Visit Discharge Summary Reporting period: 04/15/2020 - 08/20/2019  Patient Details  Name: Brianna Odonnell MRN: [id number] Date of Birth: Dec 07, 1990 Referring Provider (PT): [name], DPM   Encounter Date: 08/20/2019    Past Medical History:  Diagnosis Date  . Anxiety    situational  . Complication of anesthesia   . PONV (postoperative nausea and vomiting)     Past Surgical History:  Procedure Laterality Date  . MINOR HARDWARE REMOVAL Right 05/06/2019   Procedure: REMOVAL SCREW; DEEP;  Surgeon: [name], DPM;  Location: [facility];  Service: Podiatry;  Laterality: Right;  UPREG, LATEX  . MULTIPLE TOOTH EXTRACTIONS  2009  . OPEN REDUCTION INTERNAL FIXATION (ORIF) FOOT LISFRANC FRACTURE Right 02/20/2019   Procedure: LISFRANC;EACH JOINT X 2 RIGHT;  Surgeon: [name], DPM;  Location: [facility];  Service: Podiatry;  Laterality: Right;  GENERAL, POP/SAPH PREOP BLOCK UPREG  . TONSILLECTOMY    . UTERINE SEPTUM RESECTION  2019  . WISDOM TOOTH EXTRACTION      There were no vitals filed for this visit.  Subjective Assessment - 08/20/19 1131    Subjective  Patient did not return following last progress note/treatment session 1/21/2021and is now being discharged. On that date it was agreed that patient felt mostly confident with discharge but wanted to leave case open at least until end of current cert period in case she need further assistance with her rehab. She did not return, so her case is now being discharged.    Pertinent History  Patient is a 29 y.o. female who presents to outpatient physical therapy with a referral for medical diagnosis closed displced fracture of second metatarsal bone of R foot; post op R lisfranc ORIF. This patient's chief complaints consist of decreased  function, pain, and stiffness of R foot leading to the following functional deficits: prolonged standing, showering, walking, cannot work, cannot drive, running, hiking, any activities requiring weight bearing of the R foot. Relevant past medical history and comorbidities include DENIES: brain, lung, diabetes, heart problems. .    Limitations  Standing;Walking;House hold activities;Other (comment)   Functional limitations: prolonged standing, showering, walking, cannot work, cannot drive, running, hiking, any activities requiring weight bearing of the R foot.   How long can you sit comfortably?  no limitations    How long can you stand comfortably?  25 min    How long can you walk comfortably?  takes longer and cannot take dogs on a walk.    Diagnostic tests  screw placement  excellent across medial cuneiform to second metatarsal.    Patient Stated Goals  She would like to go back to work and would like to be able to drive. Her doctor thinks it will be after the first of the year before she can go back to work.    Pain Onset  --       OBJECTIVE Patient is not present for examination at this time. Please see previous documentation for latest objective data.      PT Short Term Goals - 06/19/19 1923      PT SHORT TERM GOAL #1   Title  Be independent with initial home exercise program for self-management of symptoms.    Baseline  Initial HEP provided at [facility] (04/16/2019);    Time  2    Period  Weeks  Status  Achieved    Target Date  04/30/19        PT Long Term Goals - 08/20/19 1134      PT LONG TERM GOAL #1   Title  Be independent with a long-term home exercise program for self-management of symptoms    Baseline  Initial HEP provided at [facility] (04/16/2019); currently participating in appropriate HEP    Time  8    Period  Weeks    Status  Achieved    Target Date  07/07/19      PT LONG TERM GOAL #2   Title  Patient will be able to return to driving and work (70/76/1518)    Baseline   Currently unable to drive or work (34/37/3578); has started driving short distances; has not yet returned to work but is in discussions with supervisor (05/12/2019); now driving, plans to return to work but not yet full capacity (05/20/2020); patient has returned to work and has some pain with multiple days in a row (06/19/2019);    Time  8    Period  Weeks    Status  Achieved    Target Date  07/07/19      PT LONG TERM GOAL #3   Title  Demonstrate improved FOTO score by 10 units to demonstrate improvement in overall condition and self-reported functional ability.    Baseline  FOTO = 56 (04/16/2019); 65 (05/12/2019); 75 (05/21/2019); 85 (06/19/2019);    Time  8    Period  Weeks    Status  Achieved    Target Date  07/07/19      PT LONG TERM GOAL #4   Title  Patient will demonstrate R ankle and great toe A/PROM equal or greater than left ankle and great toe to improve ability to ambulate with normal gait pattern and return to PLOF running, stairs, walking.    Baseline  see objective data (04/16/2019); see objective data (05/12/2019); nearly met - see objective data (06/19/2019);    Time  8    Period  Weeks    Status  Partially Met    Target Date  07/07/19      PT LONG TERM GOAL #5   Title  Patient will demonstrate equal or greater than 4+/5 strength in all directions of R ankle to improve ability to complete weight bearing activities safely.    Baseline  not tested on R due to surgical precautions (04/16/2019); see objective exam (05/12/2019); nearly met - see objective data (06/19/2019);    Time  8    Period  Weeks    Status  Partially Met    Target Date  07/07/19      PT LONG TERM GOAL #6   Title  Complete community, work and/or recreational activities without limitation due to current condition.    Baseline  prolonged standing, showering, walking, cannot work, cannot drive, running, hiking, any activities requiring weight bearing of the R foot.(04/16/2019); improving with walking boot on  but still limited (05/12/2019); now able to drive, walking in shoe with mild antalgic gait, working on returning to work with plans to go back but not full capacity next week (12/23/20200); can do everything except running, jumping, back to back work shifts over 2 nights in a row bother her (06/19/2019);    Time  8    Period  Weeks    Status  Partially Met    Target Date  07/07/19  Plan - 08/20/19 1210    Clinical Impression Statement  Patient 15 physical therapy sessions this episode of care and made great progress towards goals, meeting or nearly meeting all of them by her last visit 06/19/2019. Subjectively, she last reported being able to do all of her usual activities except running, jumping, going up and down stairs very quickly, and she continues to have some pain if she works more than 2 nights in a row. Her FOTO score improved from 56 to 85, demonstrating a significant improvement in self-reported function. Objectively, she domonstrated improved in strength, balance, ROM, and activity tolerance. Patient had stopped planned visits after 06/19/2019 to continue working on remaining impairments independently with a plan to come back in only if she had difficulty and needed further care. Patient has not returned, so she is now being discharged for meeting or nearly meeting all her goals.    Personal Factors and Comorbidities  Fitness;Past/Current Experience;Time since onset of injury/illness/exacerbation    Examination-Activity Limitations  Squat;Stairs;Lift;Locomotion Level;Stand;Caring for Others;Carry;Transfers   prolonged standing, showering, walking, cannot work, cannot drive, running, hiking, any activities requiring weight bearing of the R foot.   Examination-Participation Restrictions  Cleaning;Shop;Yard Work;Community Activity;Driving;Interpersonal Relationship   work   Stability/Clinical Decision Making  Stable/Uncomplicated    Rehab Potential  Excellent    PT Frequency  2x  / week    PT Duration  8 weeks    PT Treatment/Interventions  ADLs/Self Care Home Management;Biofeedback;Cryotherapy;Electrical Stimulation;Moist Heat;Gait training;Stair training;Functional mobility training;Therapeutic activities;Therapeutic exercise;Balance training;Neuromuscular re-education;Patient/family education;Manual techniques;Dry needling;Taping;Passive range of motion;Scar mobilization;Spinal Manipulations;Joint Manipulations    PT Next Visit Plan  Patient is now discharged from physical therapy due to meeting or nearly meeting all of her goals.    PT Home Exercise Plan  Med[employer] Access Code: [email or web address]    Consulted and Agree with Plan of Care  Patient       Patient will benefit from skilled therapeutic intervention in order to improve the following deficits and impairments:  Abnormal gait, Decreased balance, Decreased endurance, Decreased mobility, Decreased skin integrity, Difficulty walking, Hypomobility, Increased muscle spasms, Decreased knowledge of precautions, Decreased range of motion, Decreased scar mobility, Increased edema, Impaired perceived functional ability, Improper body mechanics, Decreased activity tolerance, Decreased coordination, Decreased strength, Increased fascial restricitons, Impaired flexibility, Pain  Visit Diagnosis: Pain in right foot  Stiffness of right foot, not elsewhere classified  Stiffness of right ankle, not elsewhere classified  Muscle weakness (generalized)  Difficulty in walking, not elsewhere classified     Problem List There are no problems to display for this patient.   Everlean Alstrom. Graylon Good, PT, DPT 08/20/19, 12:10 PM  [facility] [address] [place], [address] Phone: [phone]   Fax:  [phone]  Name: Brianna Odonnell MRN: [id number] Date of Birth: 01/17/1991

## 2019-09-08 IMAGING — MR MR ABDOMEN WO/W CM
23 of 29 series · 37 of 48 positions shown · IV contrast (multihance)
Comparison: None.

CLINICAL DATA: Congenital malformation of the cervix. Evaluate for
Mullerian duct and urinary tract anomalies.

EXAM:
MRI ABDOMEN AND PELVIS WITHOUT AND WITH CONTRAST
TECHNIQUE: Multiplanar multisequence MR imaging of the abdomen and pelvis was
performed both before and after the administration of intravenous
contrast.
CONTRAST:  20mL MULTIHANCE GADOBENATE DIMEGLUMINE 529 MG/ML IV SOLN

[Series 2: cor haste · coronal · 6.0mm · 0.78mm/px · 2 of 25 slices shown]
[im 1/25]
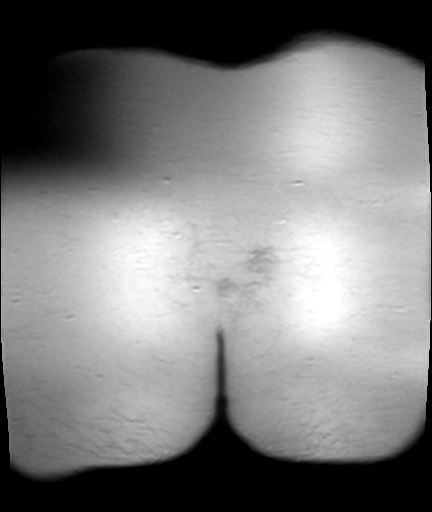
[im 25/25]
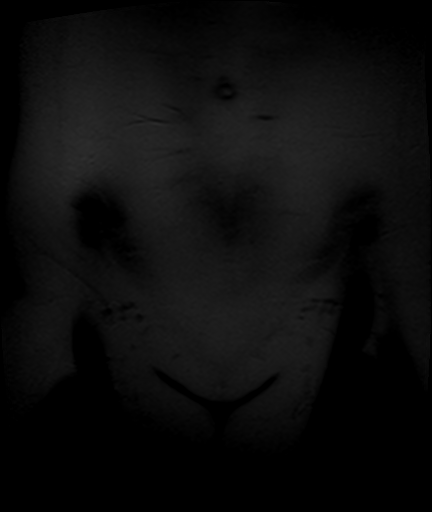

[Series 3: t2_tse_sag · sagittal · 5.0mm · 1.05mm/px · 2 of 27 slices shown]
[im 1/27]
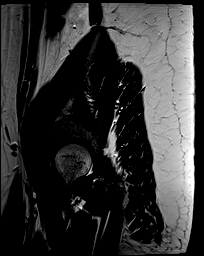
[im 27/27]
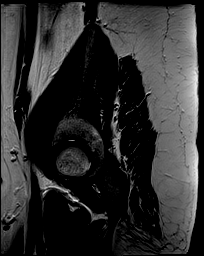

[Series 4: t2_tse axial · axial · 5.0mm · 0.55mm/px · z∈[-127,+47]mm · 2 of 30 slices shown]
[im 1/30]
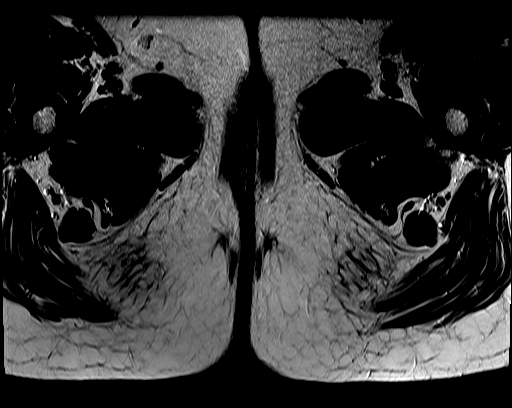
[im 30/30]
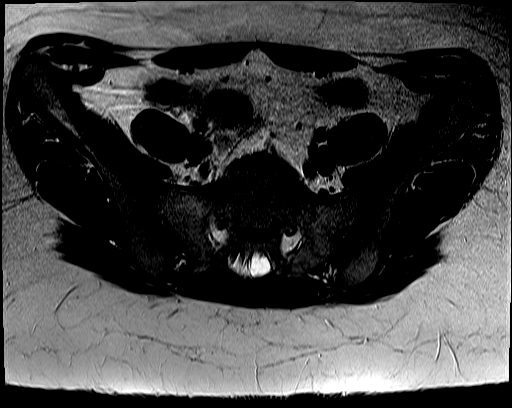

[Series 5: t2_tse axial fs · axial · 5.0mm · 0.55mm/px · z∈[-127,+47]mm · 2 of 30 slices shown]
[im 1/30]
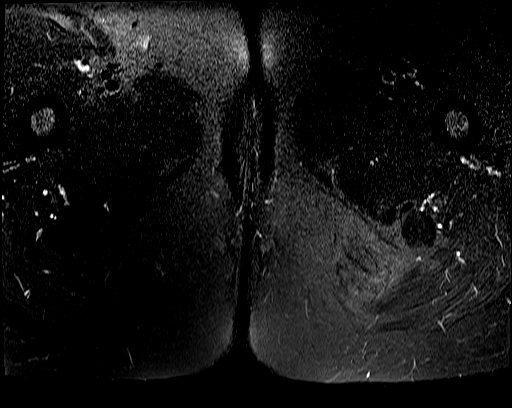
[im 30/30]
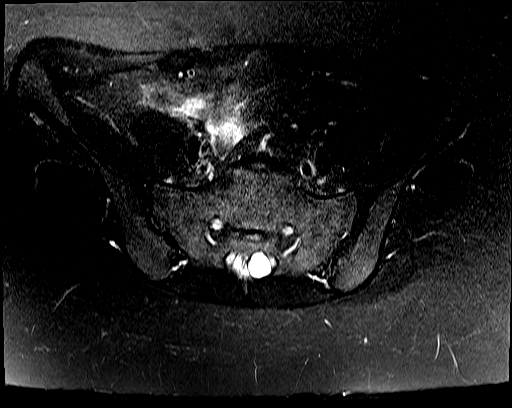

[Series 6: axial spgr · axial · 5.0mm · 0.94mm/px · z∈[-140,+34]mm · 2 of 30 slices shown (1 of 2)]
[im 1/30]
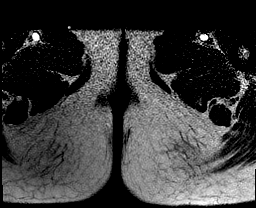
[im 30/30]
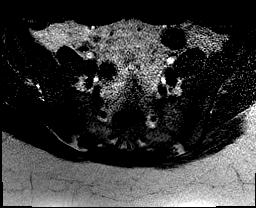

[Series 9: t2_tse_sag 2 · sagittal · 5.0mm · 1.05mm/px · 1 of 27 slices shown]
[im 1/27]
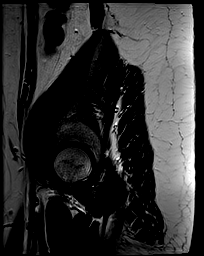

[Series 10: axial spgr pre · axial · non-contrast · 5.0mm · 0.47mm/px · 1 of 30 slices shown]
[im 1/30]
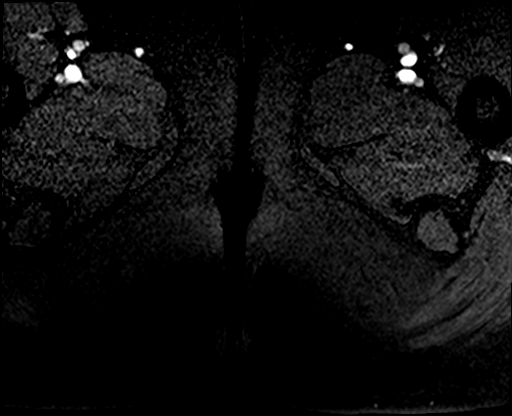

[Series 11: axial spgr · axial · 5.0mm · 0.94mm/px · 1 of 30 slices shown (2 of 2)]
[im 1/30]
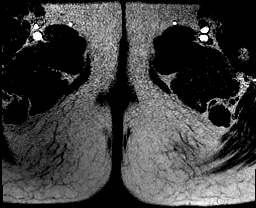

[Series 12: t2_tse_cor parallel · coronal · 5.0mm · 1.05mm/px · 1 of 30 slices shown]
[im 1/30]
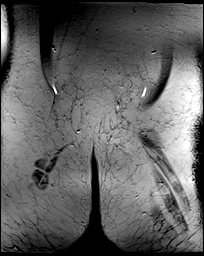

[Series 15: T2 · coronal · 6.0mm · 0.78mm/px · 1 of 23 slices shown (1 of 3)]
[im 1/23]
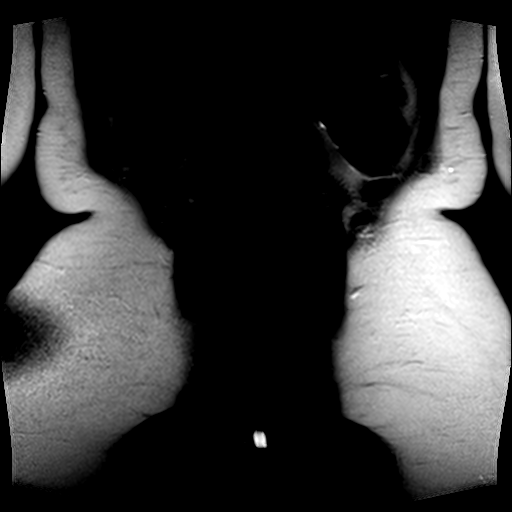

[Series 16: T2 · axial · 6.0mm · 0.66mm/px · 1 of 30 slices shown (2 of 3)]
[im 1/30]
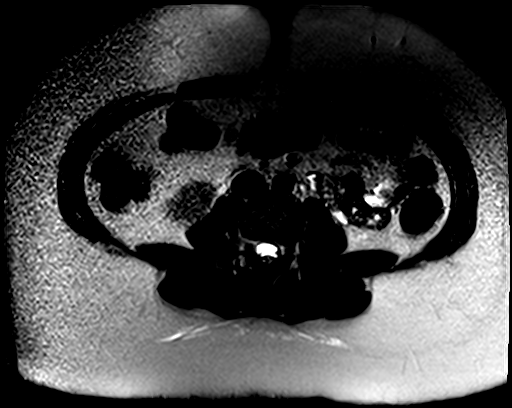

[Series 17: ep2d_diff_b50_500_800_p2_trig · axial · 6.0mm · 1.98mm/px · z∈[+124,+350]mm · 3 of 90 slices shown]
[im 1/90]
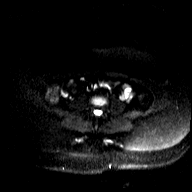
[im 45/90]
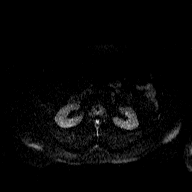
[im 90/90]
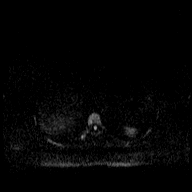

[Series 18: ep2d_diff_b50_500_800_p2_trig_adc · axial · 6.0mm · 1.98mm/px · 1 of 30 slices shown]
[im 1/30]
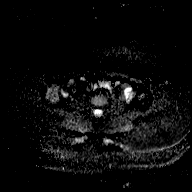

[Series 19: T2 · axial · 6.0mm · 1.33mm/px · 1 of 30 slices shown (3 of 3)]
[im 1/30]
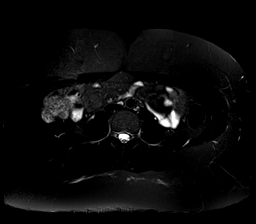

[Series 20: bSSFP · axial · 6.0mm · 0.74mm/px · 1 of 38 slices shown]
[im 1/38]
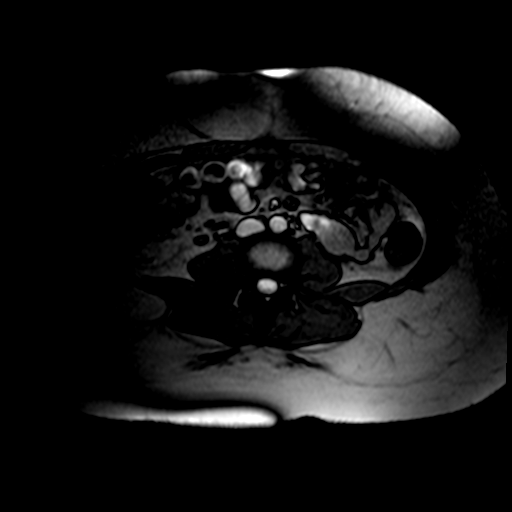

[Series 21: T1 · axial · 6.0mm · 0.66mm/px · z∈[+136,+345]mm · 2 of 60 slices shown]
[im 1/60]
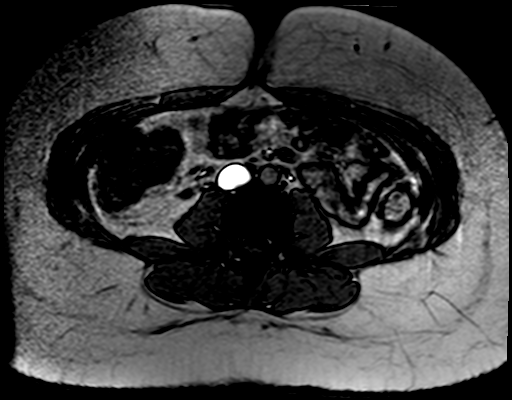
[im 60/60]
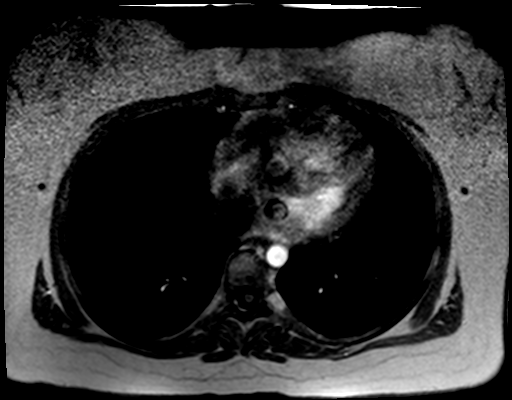

[Series 22: T1 dynamic · axial · non-contrast · 3.5mm · 1.41mm/px · z∈[+130,+336]mm · 2 of 60 slices shown]
[im 1/60]
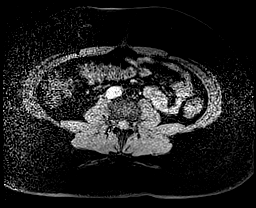
[im 60/60]
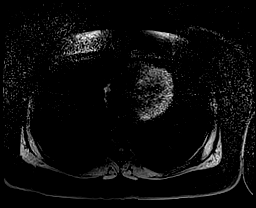

[Series 23: post 25 sec · axial · 3.5mm · 1.41mm/px · z∈[+130,+336]mm · 2 of 60 slices shown]
[im 1/60]
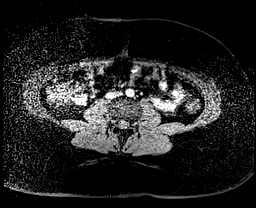
[im 60/60]
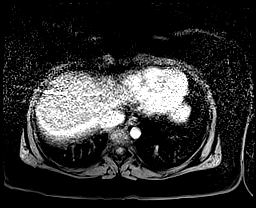

[Series 24: post 45 sec · axial · 3.5mm · 1.41mm/px · z∈[+130,+336]mm · 2 of 60 slices shown]
[im 1/60]
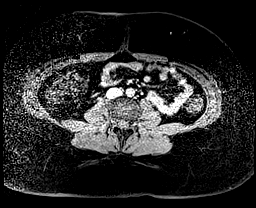
[im 60/60]
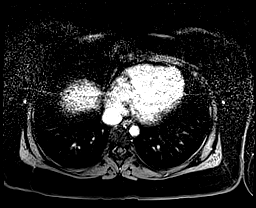

[Series 25: post 90 sec · axial · 3.5mm · 1.41mm/px · z∈[+130,+336]mm · 2 of 60 slices shown]
[im 1/60]
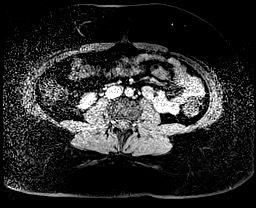
[im 60/60]
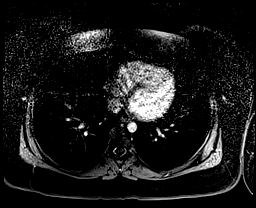

[Series 26: T1 dynamic post-contrast · coronal · 2.6mm · 0.78mm/px · 2 of 64 slices shown (1 of 2)]
[im 1/64]
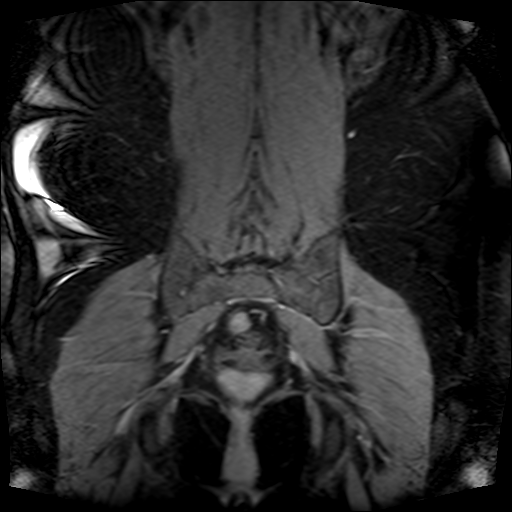
[im 64/64]
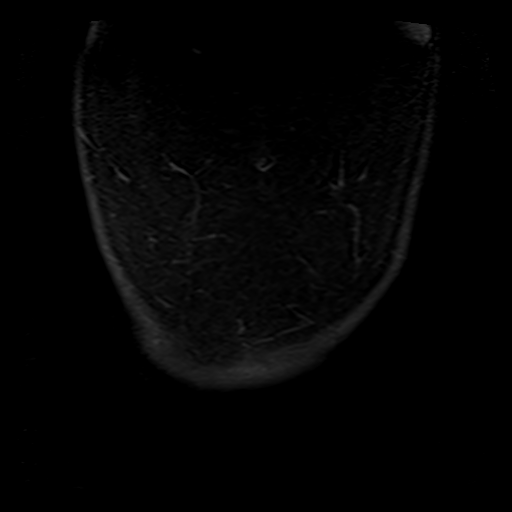

[Series 27: post axial 3+ · axial · 3.5mm · 1.41mm/px · z∈[+128,+348]mm · 2 of 64 slices shown]
[im 1/64]
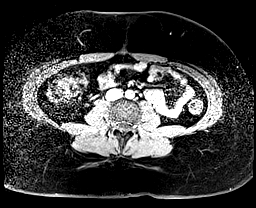
[im 64/64]
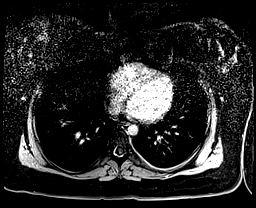

[Series 28: T1 dynamic post-contrast · coronal · 2.6mm · 0.78mm/px · 1 of 64 slices shown (2 of 2)]
[im 1/64]
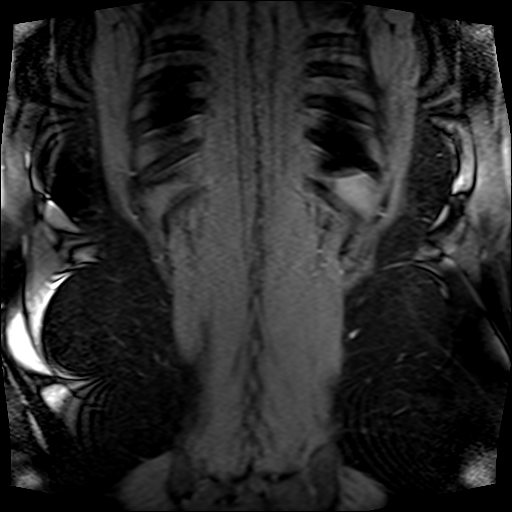

[37 of 48 positions shown; findings below may reference images not displayed]

FINDINGS: COMBINED FINDINGS FOR BOTH MR ABDOMEN AND PELVIS

Lower Chest: No acute findings.

Hepatobiliary: No hepatic masses identified. Gallbladder is
unremarkable. No evidence of biliary ductal dilatation.

Pancreas:  No mass or inflammatory changes.

Spleen: Within normal limits in size and appearance.

Adrenals/Urinary Tract: Normal adrenal glands. Both kidneys are
normal in location. No evidence of renal anomalies. No evidence of
renal mass or hydronephrosis. Unremarkable appearance of urinary
bladder.

Stomach/Bowel: No evidence of obstruction, inflammatory process or
abnormal fluid collections.

Vascular/Lymphatic: No pathologically enlarged lymph nodes. No
abdominal aortic aneurysm.

Reproductive:

-- Uterus: Measures 8.1 x 3.3 x 5.5 cm (volume = 77 cm^3). A thin
myometrial septum is seen extending throughout the endometrial
cavity and endocervical canal. There is no evidence of a fundal
myometrial cleft. This is consistent with a complete septate uterus.
A 1 cm subserosal fibroid is seen in the posterior fundus. No other
fibroids identified.

-- Right ovary: Appears normal. No mass or inflammatory process
identified.

-- Left ovary: Appears normal. No mass or inflammatory process
identified.

Other: Small amount of free fluid in pelvic cul-de-sac, most likely
physiologic.

Musculoskeletal:  No suspicious bone lesions.
IMPRESSION: Complete septate uterus.  (WEB classification 5a)

1 cm subserosal fibroid in posterior uterine fundus.

No other significant abnormality identified in abdomen or pelvis. No
evidence of renal or urinary tract anomalies.
